# Patient Record
Sex: Female | Born: 1965 | Race: Black or African American | Hispanic: No | Marital: Single | Smoking: Never smoker
Health system: Southern US, Community
[De-identification: ages and names within clinical notes are randomized; demographics above are authoritative.]

## PROBLEM LIST (undated history)

## (undated) DIAGNOSIS — D500 Iron deficiency anemia secondary to blood loss (chronic): Secondary | ICD-10-CM

## (undated) DIAGNOSIS — IMO0002 Reserved for concepts with insufficient information to code with codable children: Secondary | ICD-10-CM

## (undated) DIAGNOSIS — R7989 Other specified abnormal findings of blood chemistry: Secondary | ICD-10-CM

## (undated) DIAGNOSIS — D75838 Other thrombocytosis: Secondary | ICD-10-CM

## (undated) DIAGNOSIS — N840 Polyp of corpus uteri: Secondary | ICD-10-CM

## (undated) DIAGNOSIS — N920 Excessive and frequent menstruation with regular cycle: Secondary | ICD-10-CM

## (undated) DIAGNOSIS — R9430 Abnormal result of cardiovascular function study, unspecified: Secondary | ICD-10-CM

## (undated) DIAGNOSIS — D259 Leiomyoma of uterus, unspecified: Secondary | ICD-10-CM

## (undated) DIAGNOSIS — Z8619 Personal history of other infectious and parasitic diseases: Secondary | ICD-10-CM

## (undated) DIAGNOSIS — I10 Essential (primary) hypertension: Secondary | ICD-10-CM

## (undated) DIAGNOSIS — H53121 Transient visual loss, right eye: Secondary | ICD-10-CM

## (undated) HISTORY — PX: DILATION AND CURETTAGE OF UTERUS: SHX78

## (undated) HISTORY — DX: Iron deficiency anemia secondary to blood loss (chronic): D50.0

---

## 2000-02-22 ENCOUNTER — Ambulatory Visit (HOSPITAL_COMMUNITY): Admission: AD | Admit: 2000-02-22 | Discharge: 2000-02-22 | Payer: Self-pay | Admitting: Obstetrics

## 2000-02-26 ENCOUNTER — Ambulatory Visit (HOSPITAL_COMMUNITY): Admission: RE | Admit: 2000-02-26 | Discharge: 2000-02-26 | Payer: Self-pay | Admitting: *Deleted

## 2000-10-18 HISTORY — PX: TUBAL LIGATION: SHX77

## 2005-06-01 ENCOUNTER — Emergency Department (HOSPITAL_COMMUNITY): Admission: EM | Admit: 2005-06-01 | Discharge: 2005-06-01 | Payer: Self-pay | Admitting: Emergency Medicine

## 2005-07-21 ENCOUNTER — Ambulatory Visit (HOSPITAL_COMMUNITY): Admission: RE | Admit: 2005-07-21 | Discharge: 2005-07-21 | Payer: Self-pay | Admitting: *Deleted

## 2011-11-08 ENCOUNTER — Encounter (HOSPITAL_COMMUNITY): Payer: Self-pay | Admitting: *Deleted

## 2011-11-08 ENCOUNTER — Emergency Department (HOSPITAL_COMMUNITY)
Admission: EM | Admit: 2011-11-08 | Discharge: 2011-11-08 | Disposition: A | Discharge status: Home / Self Care | Payer: Self-pay | Attending: Emergency Medicine | Admitting: Emergency Medicine

## 2011-11-08 DIAGNOSIS — K029 Dental caries, unspecified: Secondary | ICD-10-CM | POA: Insufficient documentation

## 2011-11-08 DIAGNOSIS — K0889 Other specified disorders of teeth and supporting structures: Secondary | ICD-10-CM

## 2011-11-08 DIAGNOSIS — R220 Localized swelling, mass and lump, head: Secondary | ICD-10-CM | POA: Insufficient documentation

## 2011-11-08 DIAGNOSIS — K089 Disorder of teeth and supporting structures, unspecified: Secondary | ICD-10-CM | POA: Insufficient documentation

## 2011-11-08 DIAGNOSIS — R221 Localized swelling, mass and lump, neck: Secondary | ICD-10-CM | POA: Insufficient documentation

## 2011-11-08 MED ORDER — OXYCODONE-ACETAMINOPHEN 5-325 MG PO TABS
2.0000 | ORAL_TABLET | Freq: Once | ORAL | Status: AC
  Administered 2011-11-08: 2 via ORAL
  Filled 2011-11-08: qty 2

## 2011-11-08 MED ORDER — PENICILLIN V POTASSIUM 500 MG PO TABS: 500.0000 mg | ORAL_TABLET | Freq: Three times a day (TID) | ORAL | Status: AC

## 2011-11-08 MED ORDER — OXYCODONE-ACETAMINOPHEN 5-325 MG PO TABS: 1.0000 | ORAL_TABLET | Freq: Four times a day (QID) | ORAL | Status: AC | PRN

## 2011-11-08 NOTE — ED Provider Notes (Signed)
Medical screening examination/treatment/procedure(s) were performed by non-physician practitioner and as supervising physician I was immediately available for consultation/collaboration.   Jaisean Monteforte A Patsy Zaragoza, MD 11/08/11 0633 

## 2011-11-08 NOTE — ED Notes (Signed)
Pt presents w/ c/o dental pain, radiating to face and ear on L lower jaw.

## 2011-11-08 NOTE — ED Provider Notes (Signed)
History     CSN: [id number]  Arrival date & time 11/08/11  0026   First MD Initiated Contact with Patient 11/08/11 [phone]      Chief Complaint  Patient presents with  . Oral Swelling    (Consider location/radiation/quality/duration/timing/severity/associated sxs/prior treatment) HPI   Patient presents to the emergency department with a dental complaint. Symptoms began a couple days ago. The patient has tried to alleviate pain with tylenol and motrin.  Pain rated at a 10/10, characterized as throbbing in nature and located left lower molar. Patient denies fever, night sweats, chills, difficulty swallowing or opening mouth, SOB, nuchal rigidity or decreased ROM of neck.  Patient does not have a dentist and requests a resource guide at discharge.   History reviewed. No pertinent past medical history.  History reviewed. No pertinent past surgical history.  History reviewed. No pertinent family history.  History  Substance Use Topics  . Smoking status: Never Smoker   . Smokeless tobacco: Not on file  . Alcohol Use: No    OB History    Grav Para Term Preterm Abortions TAB SAB Ect Mult Living                  Review of Systems  All other systems reviewed and are negative.    Allergies  Review of patient's allergies indicates not on file.  Home Medications   Current Outpatient Rx  Name Route Sig Dispense Refill  . NAPROXEN SODIUM 220 MG PO TABS Oral Take 220 mg by mouth as needed.      BP 167/112  Pulse 92  Temp(Src) 99.6 F (37.6 C) (Oral)  Resp 20  SpO2 100%  LMP 10/10/2011  Physical Exam  Constitutional: She appears well-developed and well-nourished. No distress.  HENT:  Head: Normocephalic and atraumatic.  Mouth/Throat: Uvula is midline, oropharynx is clear and moist and mucous membranes are normal. Normal dentition. Dental caries (Pts tooth shows no obvious abscess but moderate to severe tenderness to palpation of marked tooth) present. No uvula swelling.     Eyes: Pupils are equal, round, and reactive to light.  Neck: Trachea normal, normal range of motion and full passive range of motion without pain. Neck supple.  Cardiovascular: Normal rate, regular rhythm, normal heart sounds and normal pulses.   Pulmonary/Chest: Effort normal and breath sounds normal. No respiratory distress. Chest wall is not dull to percussion. She exhibits no tenderness, no crepitus, no edema, no deformity and no retraction.  Abdominal: Normal appearance.  Musculoskeletal: Normal range of motion.  Neurological: She is alert. She has normal strength.  Skin: Skin is warm, dry and intact. She is not diaphoretic.  Psychiatric: She has a normal mood and affect. Her speech is normal. Cognition and memory are normal.    ED Course  Procedures (including critical care time)  Labs Reviewed - No data to display No results found.   1. Pain, dental       MDM  Pt given abx, pain medication and a referral to dentist.        Dorthula Matas, PA 11/08/11 0102

## 2015-05-14 ENCOUNTER — Other Ambulatory Visit (HOSPITAL_COMMUNITY)
Admission: RE | Admit: 2015-05-14 | Discharge: 2015-05-14 | Disposition: A | Discharge status: Home / Self Care | Payer: 59 | Source: Ambulatory Visit | Attending: Family | Admitting: Family

## 2015-05-14 ENCOUNTER — Other Ambulatory Visit: Payer: Self-pay

## 2015-05-14 DIAGNOSIS — Z01419 Encounter for gynecological examination (general) (routine) without abnormal findings: Secondary | ICD-10-CM | POA: Insufficient documentation

## 2015-05-14 DIAGNOSIS — N760 Acute vaginitis: Secondary | ICD-10-CM | POA: Diagnosis present

## 2015-05-14 DIAGNOSIS — Z113 Encounter for screening for infections with a predominantly sexual mode of transmission: Secondary | ICD-10-CM | POA: Insufficient documentation

## 2015-05-15 ENCOUNTER — Other Ambulatory Visit: Payer: Self-pay | Admitting: Family

## 2015-05-15 DIAGNOSIS — Z1231 Encounter for screening mammogram for malignant neoplasm of breast: Secondary | ICD-10-CM

## 2015-05-16 LAB — CYTOLOGY - PAP

## 2015-05-26 ENCOUNTER — Other Ambulatory Visit: Payer: Self-pay | Admitting: Family

## 2015-05-26 ENCOUNTER — Other Ambulatory Visit: Payer: Self-pay

## 2015-05-26 DIAGNOSIS — Z1231 Encounter for screening mammogram for malignant neoplasm of breast: Secondary | ICD-10-CM

## 2015-05-26 DIAGNOSIS — N924 Excessive bleeding in the premenopausal period: Secondary | ICD-10-CM

## 2015-05-27 ENCOUNTER — Other Ambulatory Visit: Payer: Self-pay | Admitting: Hematology and Oncology

## 2015-05-27 DIAGNOSIS — D539 Nutritional anemia, unspecified: Secondary | ICD-10-CM

## 2015-05-28 ENCOUNTER — Ambulatory Visit
Admission: RE | Admit: 2015-05-28 | Discharge: 2015-05-28 | Disposition: A | Discharge status: Home / Self Care | Payer: 59 | Source: Ambulatory Visit | Attending: Family | Admitting: Family

## 2015-05-28 ENCOUNTER — Ambulatory Visit (HOSPITAL_BASED_OUTPATIENT_CLINIC_OR_DEPARTMENT_OTHER): Payer: 59 | Admitting: Hematology and Oncology

## 2015-05-28 ENCOUNTER — Encounter: Payer: Self-pay | Admitting: Hematology and Oncology

## 2015-05-28 ENCOUNTER — Encounter (INDEPENDENT_AMBULATORY_CARE_PROVIDER_SITE_OTHER): Payer: Self-pay

## 2015-05-28 ENCOUNTER — Ambulatory Visit: Payer: 59

## 2015-05-28 ENCOUNTER — Other Ambulatory Visit (HOSPITAL_BASED_OUTPATIENT_CLINIC_OR_DEPARTMENT_OTHER): Payer: 59

## 2015-05-28 ENCOUNTER — Telehealth: Payer: Self-pay | Admitting: Hematology and Oncology

## 2015-05-28 VITALS — BP 156/82 | HR 78 | Temp 98.2°F | Resp 18 | Ht 65.0 in | Wt 179.9 lb

## 2015-05-28 DIAGNOSIS — D500 Iron deficiency anemia secondary to blood loss (chronic): Secondary | ICD-10-CM

## 2015-05-28 DIAGNOSIS — N921 Excessive and frequent menstruation with irregular cycle: Secondary | ICD-10-CM | POA: Insufficient documentation

## 2015-05-28 DIAGNOSIS — N924 Excessive bleeding in the premenopausal period: Secondary | ICD-10-CM

## 2015-05-28 DIAGNOSIS — D539 Nutritional anemia, unspecified: Secondary | ICD-10-CM

## 2015-05-28 HISTORY — DX: Iron deficiency anemia secondary to blood loss (chronic): D50.0

## 2015-05-28 LAB — CBC & DIFF AND RETIC
BASO%: 0.9 % (ref 0.0–2.0)
BASOS ABS: 0.1 10*3/uL (ref 0.0–0.1)
EOS%: 2.6 % (ref 0.0–7.0)
Eosinophils Absolute: 0.2 10*3/uL (ref 0.0–0.5)
HEMATOCRIT: 22.5 % — AB (ref 34.8–46.6)
HEMOGLOBIN: 5.6 g/dL — AB (ref 11.6–15.9)
IMMATURE RETIC FRACT: 33.5 % — AB (ref 1.60–10.00)
LYMPH#: 2.9 10*3/uL (ref 0.9–3.3)
LYMPH%: 33.1 % (ref 14.0–49.7)
MCH: 16.4 pg — AB (ref 25.1–34.0)
MCHC: 24.9 g/dL — AB (ref 31.5–36.0)
MCV: 66 fL — ABNORMAL LOW (ref 79.5–101.0)
MONO#: 0.5 10*3/uL (ref 0.1–0.9)
MONO%: 5.9 % (ref 0.0–14.0)
NEUT#: 5 10*3/uL (ref 1.5–6.5)
NEUT%: 57.5 % (ref 38.4–76.8)
Platelets: 637 10*3/uL — ABNORMAL HIGH (ref 145–400)
RBC: 3.41 10*6/uL — ABNORMAL LOW (ref 3.70–5.45)
RDW: 30.5 % — AB (ref 11.2–14.5)
RETIC %: 5.22 % — AB (ref 0.70–2.10)
RETIC CT ABS: 178 10*3/uL — AB (ref 33.70–90.70)
WBC: 8.7 10*3/uL (ref 3.9–10.3)
nRBC: 0 % (ref 0–0)

## 2015-05-28 LAB — FERRITIN CHCC: Ferritin: 21 ng/ml (ref 9–269)

## 2015-05-28 LAB — MORPHOLOGY: PLT EST: INCREASED

## 2015-05-28 LAB — IRON AND TIBC CHCC
%SAT: 4 % — ABNORMAL LOW (ref 21–57)
Iron: 15 ug/dL — ABNORMAL LOW (ref 41–142)
TIBC: 394 ug/dL (ref 236–444)
UIBC: 379 ug/dL (ref 120–384)

## 2015-05-28 LAB — VITAMIN B12: VITAMIN B 12: 434 pg/mL (ref 211–911)

## 2015-05-28 LAB — HOLD TUBE, BLOOD BANK

## 2015-05-28 NOTE — Telephone Encounter (Signed)
per pof to sch pt appt-gave pt copy of avs °

## 2015-05-28 NOTE — Progress Notes (Signed)
[facility] CONSULT NOTE  Patient Care Team: [name], NP as PCP - General (Nurse Practitioner)  CHIEF COMPLAINTS/PURPOSE OF CONSULTATION:  Severe iron deficiency anemia from menorrhagia  HISTORY OF PRESENTING ILLNESS:  Patricia Perry 49 y.o. female is here because of recent finding of severe iron deficiency anemia. The patient went to see her primary doctor recently for routine physical examination. Blood draw showed Severe iron deficiency anemia and the patient declined blood transfusion. Her hemoglobin that was drawn recently was only 4.1 with significant microcytosis.  She denies recent chest pain on exertion, shortness of breath on minimal exertion, pre-syncopal episodes, or palpitations. She complained of frequent leg cramps at night. She had not noticed any recent bleeding such as epistaxis, hematuria or hematochezia The patient denies regular over the counter NSAID ingestion. She is not  on antiplatelets agents. She never have GI workup. She had no prior history or diagnosis of cancer. Her age appropriate screening programs are up-to-date. She denies any pica and eats a variety of diet. She never donated blood or received blood transfusion The patient was prescribed oral iron supplements and she takes 1 supplement a day for the past few days. She tolerated that well.  MEDICAL HISTORY:  Past Medical History  Diagnosis Date  . Anemia   . Iron deficiency anemia due to chronic blood loss 05/28/2015    SURGICAL HISTORY: History reviewed. No pertinent past surgical history.  SOCIAL HISTORY: Social History   Social History  . Marital Status: Single    Spouse Name: N/A  . Number of Children: N/A  . Years of Education: N/A   Occupational History  . Not on file.   Social History Main Topics  . Smoking status: Never Smoker   . Smokeless tobacco: Never Used  . Alcohol Use: No  . Drug Use: No  . Sexual Activity: Yes    Birth Control/ Protection: None,  Surgical   Other Topics Concern  . Not on file   Social History Narrative    FAMILY HISTORY: History reviewed. No pertinent family history.  ALLERGIES:  has No Known Allergies.  MEDICATIONS:  Current Outpatient Prescriptions  Medication Sig Dispense Refill  . ferrous sulfate 325 (65 FE) MG tablet Take 325 mg by mouth daily with breakfast.    . valACYclovir (VALTREX) 500 MG tablet Take 500 mg by mouth daily.  11   No current facility-administered medications for this visit.    REVIEW OF SYSTEMS:   Constitutional: Denies fevers, chills or abnormal night sweats Eyes: Denies blurriness of vision, double vision or watery eyes Ears, nose, mouth, throat, and face: Denies mucositis or sore throat Respiratory: Denies cough, dyspnea or wheezes Cardiovascular: Denies palpitation, chest discomfort or lower extremity swelling Gastrointestinal:  Denies nausea, heartburn or change in bowel habits Skin: Denies abnormal skin rashes Lymphatics: Denies new lymphadenopathy or easy bruising Neurological:Denies numbness, tingling or new weaknesses Behavioral/Psych: Mood is stable, no new changes  All other systems were reviewed with the patient and are negative.  PHYSICAL EXAMINATION: ECOG PERFORMANCE STATUS: 1 - Symptomatic but completely ambulatory  Filed Vitals:   05/28/15 1142  BP: 156/82  Pulse: 78  Temp: 98.2 F (36.8 C)  Resp: 18   Filed Weights   05/28/15 1142  Weight: 179 lb 14.4 oz (81.602 kg)    GENERAL:alert, no distress and comfortable SKIN: skin color, texture, turgor are normal, no rashes or significant lesions EYES: normal, conjunctiva are pale and non-injected, sclera clear OROPHARYNX:no exudate, no erythema and  lips, buccal mucosa, and tongue normal  NECK: supple, thyroid normal size, non-tender, without nodularity LYMPH:  no palpable lymphadenopathy in the cervical, axillary or inguinal LUNGS: clear to auscultation and percussion with normal breathing  effort HEART: regular rate & rhythm and no murmurs and no lower extremity edema ABDOMEN:abdomen soft, non-tender and normal bowel sounds Musculoskeletal:no cyanosis of digits and no clubbing  PSYCH: alert & oriented x 3 with fluent speech NEURO: no focal motor/sensory deficits  LABORATORY DATA:  I have reviewed the data as listed Recent Results (from the past 2160 hour(s))  Cytology - PAP     Status: None   Collection Time: 05/14/15 12:00 AM  Result Value Ref Range   CYTOLOGY - PAP PAP RESULT   CBC & Diff and Retic     Status: Abnormal   Collection Time: 05/28/15 11:14 AM  Result Value Ref Range   WBC 8.7 3.9 - 10.3 10e3/uL   NEUT# 5.0 1.5 - 6.5 10e3/uL   HGB 5.6 (LL) 11.6 - 15.9 g/dL   HCT 22.5 (L) 34.8 - 46.6 %   Platelets 637 (H) 145 - 400 10e3/uL   MCV 66.0 (L) 79.5 - 101.0 fL   MCH 16.4 (L) 25.1 - 34.0 pg   MCHC 24.9 (L) 31.5 - 36.0 g/dL   RBC 3.41 (L) 3.70 - 5.45 10e6/uL   RDW 30.5 (H) 11.2 - 14.5 %   lymph# 2.9 0.9 - 3.3 10e3/uL   MONO# 0.5 0.1 - 0.9 10e3/uL   Eosinophils Absolute 0.2 0.0 - 0.5 10e3/uL   Basophils Absolute 0.1 0.0 - 0.1 10e3/uL   NEUT% 57.5 38.4 - 76.8 %   LYMPH% 33.1 14.0 - 49.7 %   MONO% 5.9 0.0 - 14.0 %   EOS% 2.6 0.0 - 7.0 %   BASO% 0.9 0.0 - 2.0 %   nRBC 0 0 - 0 %   Retic % 5.22 (H) 0.70 - 2.10 %   Retic Ct Abs 178.00 (H) 33.70 - 90.70 10e3/uL   Immature Retic Fract 33.50 (H) 1.60 - 10.00 %  Ferritin     Status: None   Collection Time: 05/28/15 11:14 AM  Result Value Ref Range   Ferritin 21 9 - 269 ng/ml  Morphology     Status: None   Collection Time: 05/28/15 11:14 AM  Result Value Ref Range   Polychromasia Moderate Slight   Tear Drop Cells Few Negative   Ovalocytes Moderate Negative   Shistocytes Few Negative   Target Cells Occ Negative   White Cell Comments C/W auto diff    PLT EST Increased Adequate   Platelet Morphology Occ Large Platelets Within Normal Limits  Iron and TIBC     Status: Abnormal   Collection Time: 05/28/15  11:14 AM  Result Value Ref Range   Iron 15 (L) 41 - 142 ug/dL   TIBC 394 236 - 444 ug/dL   UIBC 379 120 - 384 ug/dL   %SAT 4 (L) 21 - 57 %  Hold Tube, Blood Bank     Status: None   Collection Time: 05/28/15 11:14 AM  Result Value Ref Range   Hold Tube, Blood Bank Blood Bank Order Cancelled     RADIOGRAPHIC STUDIES: I have personally reviewed the radiological images as listed and agreed with the findings in the report. US Transvaginal Non-ob  05/28/2015   CLINICAL DATA:  Menorrhagia, patient is premenopausal and is on tamoxifen  EXAM: TRANSABDOMINAL AND TRANSVAGINAL ULTRASOUND OF PELVIS  TECHNIQUE: Both transabdominal and transvaginal ultrasound  examinations of the pelvis were performed. Transabdominal technique was performed for global imaging of the pelvis including uterus, ovaries, adnexal regions, and pelvic cul-de-sac. It was necessary to proceed with endovaginal exam following the transabdominal exam to visualize the endometrium and adnexal structures.  COMPARISON:  None in PACs  FINDINGS: Uterus  Measurements: 9.7 x 4.7 x 5.1 cm. There is an anterior fundal fibroid measuring 2.7 x 2.5 x 2.3 cm.  Endometrium  Thickness: 9.4 mm.  No focal abnormality visualized.  Right ovary  Measurements: 3.2 x 1.9 x 1.8 cm. Normal appearance/no adnexal mass.  Left ovary  Measurements: 3.9 x 2.4 x 1.7 cm. Normal appearance/no adnexal mass.  Other findings  There is no free pelvic fluid.  IMPRESSION: 1. There is a an anterior fundal fibroid measuring 2.7 cm in greatest dimension. The myometrium is otherwise normal. The endometrial stripe is not abnormally thickened. If bleeding remains unresponsive to hormonal or medical therapy, sonohysterogram should be considered for focal lesion work-up. (Ref: Radiological Reasoning: Algorithmic Workup of Abnormal Vaginal Bleeding with Endovaginal Sonography and Sonohysterography. AJR 2008; 676:P95-09) 2. The ovaries and adnexal regions are unremarkable. There is no free  pelvic fluid.   Electronically Signed   By: David  Martinique M.D.   On: 05/28/2015 15:02   US Pelvis Complete  05/28/2015   CLINICAL DATA:  Menorrhagia, patient is premenopausal and is on tamoxifen  EXAM: TRANSABDOMINAL AND TRANSVAGINAL ULTRASOUND OF PELVIS  TECHNIQUE: Both transabdominal and transvaginal ultrasound examinations of the pelvis were performed. Transabdominal technique was performed for global imaging of the pelvis including uterus, ovaries, adnexal regions, and pelvic cul-de-sac. It was necessary to proceed with endovaginal exam following the transabdominal exam to visualize the endometrium and adnexal structures.  COMPARISON:  None in PACs  FINDINGS: Uterus  Measurements: 9.7 x 4.7 x 5.1 cm. There is an anterior fundal fibroid measuring 2.7 x 2.5 x 2.3 cm.  Endometrium  Thickness: 9.4 mm.  No focal abnormality visualized.  Right ovary  Measurements: 3.2 x 1.9 x 1.8 cm. Normal appearance/no adnexal mass.  Left ovary  Measurements: 3.9 x 2.4 x 1.7 cm. Normal appearance/no adnexal mass.  Other findings  There is no free pelvic fluid.  IMPRESSION: 1. There is a an anterior fundal fibroid measuring 2.7 cm in greatest dimension. The myometrium is otherwise normal. The endometrial stripe is not abnormally thickened. If bleeding remains unresponsive to hormonal or medical therapy, sonohysterogram should be considered for focal lesion work-up. (Ref: Radiological Reasoning: Algorithmic Workup of Abnormal Vaginal Bleeding with Endovaginal Sonography and Sonohysterography. AJR 2008; 326:Z12-45) 2. The ovaries and adnexal regions are unremarkable. There is no free pelvic fluid.   Electronically Signed   By: David  Martinique M.D.   On: 05/28/2015 15:02    ASSESSMENT & PLAN:  Iron deficiency anemia due to chronic blood loss The patient has severe iron deficiency anemia but appears to have responded to oral iron supplements. She is not symptomatic from leg cramps. I recommend she increase oral iron supplement to  twice a day along with prenatal vitamin daily. If she continues to improve on this treatment, we may not have to give her blood transfusion or IV iron. I plan to see her back next month for further assessment.  Menorrhagia with irregular cycle She has irregular menstruation along with perimenopausal symptoms. Recent Pap smear was negative. I recommend she follows with primary care doctor for medical management of menorrhagia    All questions were answered. The patient knows to call the clinic with  any problems, questions or concerns. I spent 30 minutes counseling the patient face to face. The total time spent in the appointment was 40 minutes and more than 50% was on counseling.     [facility], Skyy Nilan, MD 05/28/2015 3:08 PM

## 2015-05-28 NOTE — Assessment & Plan Note (Signed)
She has irregular menstruation along with perimenopausal symptoms. Recent Pap smear was negative. I recommend she follows with primary care doctor for medical management of menorrhagia

## 2015-05-28 NOTE — Assessment & Plan Note (Signed)
The patient has severe iron deficiency anemia but appears to have responded to oral iron supplements. She is not symptomatic from leg cramps. I recommend she increase oral iron supplement to twice a day along with prenatal vitamin daily. If she continues to improve on this treatment, we may not have to give her blood transfusion or IV iron. I plan to see her back next month for further assessment.

## 2015-06-04 ENCOUNTER — Ambulatory Visit
Admission: RE | Admit: 2015-06-04 | Discharge: 2015-06-04 | Disposition: A | Discharge status: Home / Self Care | Payer: 59 | Source: Ambulatory Visit

## 2015-06-04 ENCOUNTER — Other Ambulatory Visit: Payer: Self-pay | Admitting: Obstetrics and Gynecology

## 2015-06-04 DIAGNOSIS — Z1231 Encounter for screening mammogram for malignant neoplasm of breast: Secondary | ICD-10-CM

## 2015-06-06 ENCOUNTER — Other Ambulatory Visit: Payer: Self-pay | Admitting: Family

## 2015-06-06 DIAGNOSIS — R928 Other abnormal and inconclusive findings on diagnostic imaging of breast: Secondary | ICD-10-CM

## 2015-06-18 ENCOUNTER — Other Ambulatory Visit: Payer: 59

## 2015-06-25 ENCOUNTER — Ambulatory Visit
Admission: RE | Admit: 2015-06-25 | Discharge: 2015-06-25 | Disposition: A | Discharge status: Home / Self Care | Payer: 59 | Source: Ambulatory Visit | Attending: Family | Admitting: Family

## 2015-06-25 DIAGNOSIS — R928 Other abnormal and inconclusive findings on diagnostic imaging of breast: Secondary | ICD-10-CM

## 2015-07-16 ENCOUNTER — Other Ambulatory Visit (HOSPITAL_BASED_OUTPATIENT_CLINIC_OR_DEPARTMENT_OTHER): Payer: 59

## 2015-07-16 ENCOUNTER — Encounter: Payer: Self-pay | Admitting: Hematology and Oncology

## 2015-07-16 ENCOUNTER — Ambulatory Visit (HOSPITAL_BASED_OUTPATIENT_CLINIC_OR_DEPARTMENT_OTHER): Payer: 59 | Admitting: Hematology and Oncology

## 2015-07-16 ENCOUNTER — Telehealth: Payer: Self-pay | Admitting: Hematology and Oncology

## 2015-07-16 VITALS — BP 154/87 | HR 77 | Temp 98.1°F | Resp 18 | Wt 186.9 lb

## 2015-07-16 DIAGNOSIS — N63 Unspecified lump in breast: Secondary | ICD-10-CM

## 2015-07-16 DIAGNOSIS — D500 Iron deficiency anemia secondary to blood loss (chronic): Secondary | ICD-10-CM

## 2015-07-16 DIAGNOSIS — N921 Excessive and frequent menstruation with irregular cycle: Secondary | ICD-10-CM | POA: Diagnosis not present

## 2015-07-16 DIAGNOSIS — N6320 Unspecified lump in the left breast, unspecified quadrant: Secondary | ICD-10-CM | POA: Insufficient documentation

## 2015-07-16 LAB — CBC & DIFF AND RETIC
BASO%: 0.7 % (ref 0.0–2.0)
BASOS ABS: 0.1 10*3/uL (ref 0.0–0.1)
EOS ABS: 0.4 10*3/uL (ref 0.0–0.5)
EOS%: 5.7 % (ref 0.0–7.0)
HCT: 29.1 % — ABNORMAL LOW (ref 34.8–46.6)
HEMOGLOBIN: 8 g/dL — AB (ref 11.6–15.9)
IMMATURE RETIC FRACT: 32.7 % — AB (ref 1.60–10.00)
LYMPH#: 2 10*3/uL (ref 0.9–3.3)
LYMPH%: 26 % (ref 14.0–49.7)
MCH: 19 pg — ABNORMAL LOW (ref 25.1–34.0)
MCHC: 27.5 g/dL — ABNORMAL LOW (ref 31.5–36.0)
MCV: 69 fL — ABNORMAL LOW (ref 79.5–101.0)
MONO#: 0.5 10*3/uL (ref 0.1–0.9)
MONO%: 6.2 % (ref 0.0–14.0)
NEUT#: 4.6 10*3/uL (ref 1.5–6.5)
NEUT%: 61.4 % (ref 38.4–76.8)
PLATELETS: 393 10*3/uL (ref 145–400)
RBC: 4.22 10*6/uL (ref 3.70–5.45)
RDW: 21.6 % — ABNORMAL HIGH (ref 11.2–14.5)
RETIC CT ABS: 78.07 10*3/uL (ref 33.70–90.70)
Retic %: 1.85 % (ref 0.70–2.10)
WBC: 7.5 10*3/uL (ref 3.9–10.3)

## 2015-07-16 LAB — FERRITIN CHCC: Ferritin: 72 ng/ml (ref 9–269)

## 2015-07-16 NOTE — Assessment & Plan Note (Signed)
The patient has severe iron deficiency anemia but appears to have responded to oral iron supplements. She is not symptomatic from leg cramps. I recommend she continues oral iron supplement to twice a day along with prenatal vitamin daily. I plan to see her back in 6 months for further assessment.

## 2015-07-16 NOTE — Assessment & Plan Note (Signed)
She has irregular menstruation along with perimenopausal symptoms. Recent Pap smear was negative. I recommend she follows with primary care doctor for medical management of menorrhagia 

## 2015-07-16 NOTE — Progress Notes (Signed)
[facility] OFFICE PROGRESS NOTE  [name], NP SUMMARY OF HEMATOLOGIC HISTORY: Patricia Perry 49 y.o. female is here because of recent finding of severe iron deficiency anemia. The patient went to see her primary doctor recently for routine physical examination. Blood draw showed Severe iron deficiency anemia and the patient declined blood transfusion. Her hemoglobin that was drawn recently was only 4.1 with significant microcytosis.  She denies recent chest pain on exertion, shortness of breath on minimal exertion, pre-syncopal episodes, or palpitations. She complained of frequent leg cramps at night. She had not noticed any recent bleeding such as epistaxis, hematuria or hematochezia The patient denies regular over the counter NSAID ingestion. She is not  on antiplatelets agents. She never have GI workup. She had no prior history or diagnosis of cancer. Her age appropriate screening programs are up-to-date. She denies any pica and eats a variety of diet. She never donated blood or received blood transfusion The patient was prescribed oral iron supplements and she takes 1 supplement a day . The patient was seen in August 2016 and was recommended to increase oral iron supplement to twice a day along with vitamin O-70 and folic acid. Further imaging study revealed uterine fibroids. Biopsy was negative for malignancy.  She has screening mammogram and ultrasound of the left breast which revealed abnormal mass, suspicious for fibroadenoma  INTERVAL HISTORY: Patricia Perry 49 y.o. female returns for  Further follow-up. She tolerate oral iron supplement well. Her symptoms of fatigue has improved.  she continues to have irregular periods. She was recently found to have left breast mass mass. There were no evidence of family history.  I have reviewed the past medical history, past surgical history, social history and family history with the patient and they are unchanged from  previous note.  ALLERGIES:  has No Known Allergies.  MEDICATIONS:  Current Outpatient Prescriptions  Medication Sig Dispense Refill  . ferrous sulfate 325 (65 FE) MG tablet Take 325 mg by mouth daily with breakfast.    . valACYclovir (VALTREX) 500 MG tablet Take 500 mg by mouth daily.  11   No current facility-administered medications for this visit.     REVIEW OF SYSTEMS:   Constitutional: Denies fevers, chills or night sweats Eyes: Denies blurriness of vision Ears, nose, mouth, throat, and face: Denies mucositis or sore throat Respiratory: Denies cough, dyspnea or wheezes Cardiovascular: Denies palpitation, chest discomfort or lower extremity swelling Gastrointestinal:  Denies nausea, heartburn or change in bowel habits Skin: Denies abnormal skin rashes Lymphatics: Denies new lymphadenopathy or easy bruising Neurological:Denies numbness, tingling or new weaknesses Behavioral/Psych: Mood is stable, no new changes  All other systems were reviewed with the patient and are negative.  PHYSICAL EXAMINATION: ECOG PERFORMANCE STATUS: 0 - Asymptomatic  Filed Vitals:   07/16/15 1212  BP: 154/87  Pulse: 77  Temp: 98.1 F (36.7 C)  Resp: 18   Filed Weights   07/16/15 1212  Weight: 186 lb 14.4 oz (84.777 kg)    GENERAL:alert, no distress and comfortable SKIN: skin color, texture, turgor are normal, no rashes or significant lesions EYES: normal, Conjunctiva are pink and non-injected, sclera clear Musculoskeletal:no cyanosis of digits and no clubbing  NEURO: alert & oriented x 3 with fluent speech, no focal motor/sensory deficits  LABORATORY DATA:  I have reviewed the data as listed Results for orders placed or performed in visit on 07/16/15 (from the past 48 hour(s))  CBC & Diff and Retic     Status:  Abnormal   Collection Time: 07/16/15 11:54 AM  Result Value Ref Range   WBC 7.5 3.9 - 10.3 10e3/uL   NEUT# 4.6 1.5 - 6.5 10e3/uL   HGB 8.0 (L) 11.6 - 15.9 g/dL   HCT 29.1 (L)  34.8 - 46.6 %   Platelets 393 145 - 400 10e3/uL   MCV 69.0 (L) 79.5 - 101.0 fL   MCH 19.0 (L) 25.1 - 34.0 pg   MCHC 27.5 (L) 31.5 - 36.0 g/dL   RBC 4.22 3.70 - 5.45 10e6/uL   RDW 21.6 (H) 11.2 - 14.5 %   lymph# 2.0 0.9 - 3.3 10e3/uL   MONO# 0.5 0.1 - 0.9 10e3/uL   Eosinophils Absolute 0.4 0.0 - 0.5 10e3/uL   Basophils Absolute 0.1 0.0 - 0.1 10e3/uL   NEUT% 61.4 38.4 - 76.8 %   LYMPH% 26.0 14.0 - 49.7 %   MONO% 6.2 0.0 - 14.0 %   EOS% 5.7 0.0 - 7.0 %   BASO% 0.7 0.0 - 2.0 %   Retic % 1.85 0.70 - 2.10 %   Retic Ct Abs 78.07 33.70 - 90.70 10e3/uL   Immature Retic Fract 32.70 (H) 1.60 - 10.00 %  Ferritin     Status: None   Collection Time: 07/16/15 11:54 AM  Result Value Ref Range   Ferritin 72 9 - 269 ng/ml    Lab Results  Component Value Date   WBC 7.5 07/16/2015   HGB 8.0* 07/16/2015   HCT 29.1* 07/16/2015   MCV 69.0* 07/16/2015   PLT 393 07/16/2015    ASSESSMENT & PLAN:  Iron deficiency anemia due to chronic blood loss The patient has severe iron deficiency anemia but appears to have responded to oral iron supplements. She is not symptomatic from leg cramps. I recommend she continues oral iron supplement to twice a day along with prenatal vitamin daily. I plan to see her back in 6 months for further assessment.    Menorrhagia with irregular cycle She has irregular menstruation along with perimenopausal symptoms. Recent Pap smear was negative. I recommend she follows with primary care doctor for medical management of menorrhagia    Mass of left breast on mammogram  She has breast mass on the imaging study that asymptomatic. She was recommended repeat ultrasound in 6 months and further follow-up.   All questions were answered. The patient knows to call the clinic with any problems, questions or concerns. No barriers to learning was detected.  I spent 15 minutes counseling the patient face to face. The total time spent in the appointment was 20 minutes and more  than 50% was on counseling.     [facility], NI, MD 9/28/20164:07 PM

## 2015-07-16 NOTE — Assessment & Plan Note (Signed)
She has breast mass on the imaging study that asymptomatic. She was recommended repeat ultrasound in 6 months and further follow-up.

## 2015-07-16 NOTE — Telephone Encounter (Signed)
Gave and printed appt sched and avs for pt for March 2017 °

## 2016-01-12 ENCOUNTER — Telehealth: Payer: Self-pay | Admitting: Hematology and Oncology

## 2016-01-12 NOTE — Telephone Encounter (Signed)
per Dr Alvy Bimler to r/s pt appt-cld & spoke to pt and gave pt updated time & date for 3/29 appt

## 2016-01-14 ENCOUNTER — Other Ambulatory Visit (HOSPITAL_BASED_OUTPATIENT_CLINIC_OR_DEPARTMENT_OTHER): Payer: Self-pay

## 2016-01-14 ENCOUNTER — Ambulatory Visit (HOSPITAL_BASED_OUTPATIENT_CLINIC_OR_DEPARTMENT_OTHER): Payer: Self-pay | Admitting: Hematology and Oncology

## 2016-01-14 ENCOUNTER — Other Ambulatory Visit: Payer: 59

## 2016-01-14 ENCOUNTER — Telehealth: Payer: Self-pay | Admitting: Hematology and Oncology

## 2016-01-14 ENCOUNTER — Ambulatory Visit: Payer: 59 | Admitting: Hematology and Oncology

## 2016-01-14 ENCOUNTER — Encounter: Payer: Self-pay | Admitting: Hematology and Oncology

## 2016-01-14 VITALS — BP 134/89 | HR 97 | Temp 98.1°F | Resp 18 | Ht 65.0 in | Wt 211.5 lb

## 2016-01-14 DIAGNOSIS — D500 Iron deficiency anemia secondary to blood loss (chronic): Secondary | ICD-10-CM

## 2016-01-14 DIAGNOSIS — R7989 Other specified abnormal findings of blood chemistry: Secondary | ICD-10-CM

## 2016-01-14 DIAGNOSIS — D473 Essential (hemorrhagic) thrombocythemia: Secondary | ICD-10-CM

## 2016-01-14 DIAGNOSIS — D75838 Other thrombocytosis: Secondary | ICD-10-CM | POA: Insufficient documentation

## 2016-01-14 LAB — IRON AND TIBC: TIBC: 404 ug/dL (ref 236–444)

## 2016-01-14 LAB — CBC & DIFF AND RETIC
BASO%: 1 % (ref 0.0–2.0)
Basophils Absolute: 0.1 10*3/uL (ref 0.0–0.1)
EOS ABS: 0.2 10*3/uL (ref 0.0–0.5)
EOS%: 3.1 % (ref 0.0–7.0)
HCT: 25.2 % — ABNORMAL LOW (ref 34.8–46.6)
HEMOGLOBIN: 6.5 g/dL — AB (ref 11.6–15.9)
IMMATURE RETIC FRACT: 16.7 % — AB (ref 1.60–10.00)
LYMPH#: 2.2 10*3/uL (ref 0.9–3.3)
LYMPH%: 31.9 % (ref 14.0–49.7)
MCH: 15.9 pg — ABNORMAL LOW (ref 25.1–34.0)
MCHC: 25.8 g/dL — ABNORMAL LOW (ref 31.5–36.0)
MCV: 61.8 fL — AB (ref 79.5–101.0)
MONO#: 0.4 10*3/uL (ref 0.1–0.9)
MONO%: 6.3 % (ref 0.0–14.0)
NEUT%: 57.7 % (ref 38.4–76.8)
NEUTROS ABS: 4.1 10*3/uL (ref 1.5–6.5)
Platelets: 415 10*3/uL — ABNORMAL HIGH (ref 145–400)
RBC: 4.08 10*6/uL (ref 3.70–5.45)
RDW: 18.8 % — ABNORMAL HIGH (ref 11.2–14.5)
RETIC CT ABS: 50.18 10*3/uL (ref 33.70–90.70)
Retic %: 1.23 % (ref 0.70–2.10)
WBC: 7 10*3/uL (ref 3.9–10.3)

## 2016-01-14 LAB — FERRITIN

## 2016-01-14 NOTE — Assessment & Plan Note (Signed)
She is noncompliant and has not taken iron supplement lately. I reinforced the importance of compliance. She is not excessively fatigue and decline IV iron or blood transfusion. I recommend she resume iron supplement at least daily, preferably twice a day and I will bring her back in 6 weeks to monitor her CBC.

## 2016-01-14 NOTE — Telephone Encounter (Signed)
Gave and printed appt shced and avs for pt for May °

## 2016-01-14 NOTE — Assessment & Plan Note (Signed)
This is secondary to iron deficiency.Observe only.

## 2016-01-14 NOTE — Progress Notes (Signed)
[name] OFF[facility] PROGRESS NOTE  [name], NP SUMMARY OF HEMATOLOGIC HISTORY:  Patricia Perry is here because of recent finding of severe iron deficiency anemia. The patient went to see her primary doctor recently for routine physical examination. Blood draw showed Severe iron deficiency anemia and the patient declined blood transfusion. Her hemoglobin that was drawn recently was only 4.1 with significant microcytosis.  She denies recent chest pain on exertion, shortness of breath on minimal exertion, pre-syncopal episodes, or palpitations. She complained of frequent leg cramps at night. She had not noticed any recent bleeding such as epistaxis, hematuria or hematochezia The patient denies regular over the counter NSAID ingestion. She is not  on antiplatelets agents. She never have GI workup. She had no prior history or diagnosis of cancer. Her age appropriate screening programs are up-to-date. She denies any pica and eats a variety of diet. She never donated blood or received blood transfusion The patient was prescribed oral iron supplements and she takes 1 supplement a day . The patient was seen in August 2016 and was recommended to increase oral iron supplement to twice a day along with vitamin 0000000 and folic acid. Further imaging study revealed uterine fibroids. Biopsy was negative for malignancy.  She has screening mammogram and ultrasound of the left breast which revealed abnormal mass, suspicious for fibroadenoma  INTERVAL HISTORY: Patricia Perry 50 y.o. female returns for further follow-up. She is noncompliant with taking oral iron supplements and could not remember when she last took iron supplement. She denies excessive fatigue. No chest pain, shortness of breath or palpitation. She continues to have heavy menstruation. The patient denies any recent signs or symptoms of bleeding such as spontaneous epistaxis, hematuria or hematochezia.   I have reviewed the  past medical history, past surgical history, social history and family history with the patient and they are unchanged from previous note.  ALLERGIES:  has No Known Allergies.  MEDICATIONS:  Current Outpatient Prescriptions  Medication Sig Dispense Refill  . ferrous sulfate 325 (65 FE) MG tablet Take 325 mg by mouth daily with breakfast.    . valACYclovir (VALTREX) 500 MG tablet Take 500 mg by mouth daily.  11   No current facility-administered medications for this visit.     REVIEW OF SYSTEMS:   Constitutional: Denies fevers, chills or night sweats Eyes: Denies blurriness of vision Ears, nose, mouth, throat, and face: Denies mucositis or sore throat Respiratory: Denies cough, dyspnea or wheezes Cardiovascular: Denies palpitation, chest discomfort or lower extremity swelling Gastrointestinal:  Denies nausea, heartburn or change in bowel habits Skin: Denies abnormal skin rashes Lymphatics: Denies new lymphadenopathy or easy bruising Neurological:Denies numbness, tingling or new weaknesses Behavioral/Psych: Mood is stable, no new changes  All other systems were reviewed with the patient and are negative.  PHYSICAL EXAMINATION: ECOG PERFORMANCE STATUS: 0 - Asymptomatic  Filed Vitals:   01/14/16 1137  BP: 134/89  Pulse: 97  Temp: 98.1 F (36.7 C)  Resp: 18   Filed Weights   01/14/16 1137  Weight: 211 lb 8 oz (95.936 kg)    GENERAL:alert, no distress and comfortable SKIN: skin color, texture, turgor are normal, no rashes or significant lesions EYES: normal, Conjunctiva are pink and non-injected, sclera clear Musculoskeletal:no cyanosis of digits and no clubbing  NEURO: alert & oriented x 3 with fluent speech, no focal motor/sensory deficits  LABORATORY DATA:  I have reviewed the data as listed Results for orders placed or performed in visit on 01/14/16 (from  the past 48 hour(s))  CBC & Diff and Retic     Status: Abnormal   Collection Time: 01/14/16 11:20 AM  Result  Value Ref Range   WBC 7.0 3.9 - 10.3 10e3/uL   NEUT# 4.1 1.5 - 6.5 10e3/uL   HGB 6.5 (LL) 11.6 - 15.9 g/dL   HCT 25.2 (L) 34.8 - 46.6 %   Platelets 415 (H) 145 - 400 10e3/uL   MCV 61.8 (L) 79.5 - 101.0 fL   MCH 15.9 (L) 25.1 - 34.0 pg   MCHC 25.8 (L) 31.5 - 36.0 g/dL   RBC 4.08 3.70 - 5.45 10e6/uL   RDW 18.8 (H) 11.2 - 14.5 %   lymph# 2.2 0.9 - 3.3 10e3/uL   MONO# 0.4 0.1 - 0.9 10e3/uL   Eosinophils Absolute 0.2 0.0 - 0.5 10e3/uL   Basophils Absolute 0.1 0.0 - 0.1 10e3/uL   NEUT% 57.7 38.4 - 76.8 %   LYMPH% 31.9 14.0 - 49.7 %   MONO% 6.3 0.0 - 14.0 %   EOS% 3.1 0.0 - 7.0 %   BASO% 1.0 0.0 - 2.0 %   Retic % 1.23 0.70 - 2.10 %   Retic Ct Abs 50.18 33.70 - 90.70 10e3/uL   Immature Retic Fract 16.70 (H) 1.60 - 10.00 %    Lab Results  Component Value Date   WBC 7.0 01/14/2016   HGB 6.5* 01/14/2016   HCT 25.2* 01/14/2016   MCV 61.8* 01/14/2016   PLT 415* 01/14/2016   ASSESSMENT & PLAN:  Iron deficiency anemia due to chronic blood loss She is noncompliant and has not taken iron supplement lately. I reinforced the importance of compliance. She is not excessively fatigue and decline IV iron or blood transfusion. I recommend she resume iron supplement at least daily, preferably twice a day and I will bring her back in 6 weeks to monitor her CBC.  Reactive thrombocytosis This is secondary to iron deficiency.Observe only.     All questions were answered. The patient knows to call the clinic with any problems, questions or concerns. No barriers to learning was detected.  I spent 15 minutes counseling the patient face to face. The total time spent in the appointment was 20 minutes and more than 50% was on counseling.     [facility], Nikole Swartzentruber, MD 3/29/201711:44 AM

## 2016-02-25 ENCOUNTER — Telehealth: Payer: Self-pay | Admitting: *Deleted

## 2016-02-25 ENCOUNTER — Other Ambulatory Visit (HOSPITAL_BASED_OUTPATIENT_CLINIC_OR_DEPARTMENT_OTHER): Payer: BLUE CROSS/BLUE SHIELD

## 2016-02-25 DIAGNOSIS — D500 Iron deficiency anemia secondary to blood loss (chronic): Secondary | ICD-10-CM

## 2016-02-25 LAB — CBC & DIFF AND RETIC
BASO%: 0.6 % (ref 0.0–2.0)
Basophils Absolute: 0 10*3/uL (ref 0.0–0.1)
EOS%: 3.4 % (ref 0.0–7.0)
Eosinophils Absolute: 0.2 10*3/uL (ref 0.0–0.5)
HCT: 37.5 % (ref 34.8–46.6)
HEMOGLOBIN: 11.2 g/dL — AB (ref 11.6–15.9)
IMMATURE RETIC FRACT: 4.8 % (ref 1.60–10.00)
LYMPH#: 1.9 10*3/uL (ref 0.9–3.3)
LYMPH%: 26.5 % (ref 14.0–49.7)
MCH: 23.5 pg — ABNORMAL LOW (ref 25.1–34.0)
MCHC: 29.9 g/dL — ABNORMAL LOW (ref 31.5–36.0)
MCV: 78.6 fL — AB (ref 79.5–101.0)
MONO#: 0.3 10*3/uL (ref 0.1–0.9)
MONO%: 3.5 % (ref 0.0–14.0)
NEUT%: 66 % (ref 38.4–76.8)
NEUTROS ABS: 4.7 10*3/uL (ref 1.5–6.5)
Platelets: 328 10*3/uL (ref 145–400)
RBC: 4.77 10*6/uL (ref 3.70–5.45)
RDW: 26.3 % — AB (ref 11.2–14.5)
RETIC CT ABS: 72.5 10*3/uL (ref 33.70–90.70)
Retic %: 1.52 % (ref 0.70–2.10)
WBC: 7.2 10*3/uL (ref 3.9–10.3)

## 2016-02-25 NOTE — Telephone Encounter (Signed)
Informed pt of lab results and Dr. Calton Dach message below.  She verbalized understanding.

## 2016-02-25 NOTE — Telephone Encounter (Signed)
-----   Message from Heath Lark, MD sent at 02/25/2016  1:05 PM EDT ----- Regarding: cbc PLs let her know CBC is excellent. Continue oral iron and follow-up with PCP in 3 months for repeat blood draw. No need to come back ----- Message -----    From: [facility] Interface    Sent: 02/25/2016  12:41 PM      To: Heath Lark, MD

## 2016-07-07 ENCOUNTER — Encounter: Payer: Self-pay | Admitting: Neurology

## 2016-07-07 ENCOUNTER — Ambulatory Visit (INDEPENDENT_AMBULATORY_CARE_PROVIDER_SITE_OTHER): Payer: BLUE CROSS/BLUE SHIELD | Admitting: Neurology

## 2016-07-07 VITALS — BP 156/100 | HR 71 | Ht 66.0 in | Wt 203.0 lb

## 2016-07-07 DIAGNOSIS — R7309 Other abnormal glucose: Secondary | ICD-10-CM | POA: Diagnosis not present

## 2016-07-07 DIAGNOSIS — G453 Amaurosis fugax: Secondary | ICD-10-CM | POA: Diagnosis not present

## 2016-07-07 DIAGNOSIS — H539 Unspecified visual disturbance: Secondary | ICD-10-CM

## 2016-07-07 DIAGNOSIS — G4489 Other headache syndrome: Secondary | ICD-10-CM

## 2016-07-07 DIAGNOSIS — G458 Other transient cerebral ischemic attacks and related syndromes: Secondary | ICD-10-CM

## 2016-07-07 NOTE — Progress Notes (Addendum)
[id number]    Provider:  Dr Jaynee Eagles Referring Provider: [name], NP Primary Care Physician:  [name], NP  CC:  Transient monocular vision loss.   HPI:  Patricia Perry is a 50 y.o. female here as a referral from Dr. Owens Shark for right vision loss possibly TIA vs Stroke. PMHx Hypertriglyceridemia, TIA, iron deficiency anemia. A month ago she was holding her right sheet and she could only see 1/2 the line followed by a headache. No inciting events, previous illnesses or head trauma or any triggering event.  She could not see the right side of the line. She could se the whole line in the left eye but the right eye it was 1/2. It cleared up a few minutes later.1/2 her vision of her right eye was gone. It was completely blank than it just reappeared and she got the most excruciating headache. She took ibuprofen and the headache did not go away. The headache was right sided and pounding and the temples were hurting. No light sensitivity, sound sensitivity or nausea. Headache was excruciating. The headache lasted all day. It has resolved. She has a headache every 2-3 months which are pounding but she takes something and it goes away. But the day of vision loss it was an 8/10. No other focal associated symptoms or neurologic complaints. No nausea or vomiting. The headache resolved on its own nothing helped, if she sneezed or coughed the headache became worse. Patient has had a TIA in the past in 1988 with acute onset arm numbness and weakness workup was negative per patient.  Reviewed notes, labs and imaging from outside physicians, which showed:   LDL 91  Reviewed notes from [place]. Patient presented with a severe right-sided headache that lasted for 2 days in which was preceded by vision loss in the right eye. Patient does not have a history of headaches. Headache was worsened with coughing or sneezing and slightly better with naproxen. Was not associated with head  injury, photophobia, phonophobia, weakness of her extremities. No paresthesias of extremities. Patient apparently has a history of "light stroke" in 1988 on CT scan of brain possibly due to OCPs. Primary care was concerned especially given patient's history of stroke versus TIA in 1988 and she was sent here for urgent evaluation.   Review of Systems: Patient complains of symptoms per HPI as well as the following symptoms: No CP, no SOB. Pertinent negatives per HPI. All others negative.   Social History   Social History  . Marital status: Single    Spouse name: N/A  . Number of children: 3  . Years of education: 12   Occupational History  . [employer]    Social History Main Topics  . Smoking status: Never Smoker  . Smokeless tobacco: Never Used  . Alcohol use No  . Drug use: No  . Sexual activity: Yes    Birth control/ protection: None, Surgical   Other Topics Concern  . Not on file   Social History Narrative   Lives with son   Caffeine use:     Family History  Problem Relation Age of Onset  . Diabetes Mother   . Hypertension Mother   . Migraines Neg Hx   . Stroke Neg Hx     Past Medical History:  Diagnosis Date  . Anemia   . Iron deficiency anemia due to chronic blood loss 05/28/2015  . Stroke (cerebrum) [facility])     Past Surgical History:  Procedure Laterality Date  .  NO PAST SURGERIES      Current Outpatient Prescriptions  Medication Sig Dispense Refill  . amoxicillin (AMOXIL) 500 MG tablet Take 500 mg by mouth every 12 (twelve) hours.  0   No current facility-administered medications for this visit.     Allergies as of 07/07/2016  . (No Known Allergies)    Vitals: BP (!) 156/100 (BP Location: Right Arm, Patient Position: Sitting, Cuff Size: Large)   Pulse 71   Ht 5\' 6"  (1.676 m)   Wt 203 lb (92.1 kg)   BMI 32.77 kg/m  Last Weight:  Wt Readings from Last 1 Encounters:  07/07/16 203 lb (92.1 kg)   Last Height:   Ht Readings from Last 1 Encounters:   07/07/16 5\' 6"  (1.676 m)     Physical exam: Exam: Gen: NAD, conversant, well nourised, obese, well groomed                     CV: RRR, no MRG. No Carotid Bruits. No peripheral edema, warm, nontender Eyes: Conjunctivae clear without exudates or hemorrhage  Neuro: Detailed Neurologic Exam  Speech:    Speech is normal; fluent and spontaneous with normal comprehension.  Cognition:    The patient is oriented to person, place, and time;     recent and remote memory intact;     language fluent;     normal attention, concentration,     fund of knowledge Cranial Nerves:    The pupils are equal, round, and reactive to light. The fundi are normal and spontaneous venous pulsations are present. Visual fields are full to finger confrontation. Extraocular movements are intact. Trigeminal sensation is intact and the muscles of mastication are normal. The face is symmetric. The palate elevates in the midline. Hearing intact. Voice is normal. Shoulder shrug is normal. The tongue has normal motion without fasciculations.   Coordination:    Normal finger to nose and heel to shin. Normal rapid alternating movements.   Gait:    Heel-toe and tandem gait are normal.   Motor Observation:    No asymmetry, no atrophy, and no involuntary movements noted. Tone:    Normal muscle tone.    Posture:    Posture is normal. normal erect    Strength:    Strength is V/V in the upper and lower limbs.      Sensation: intact to LT     Reflex Exam:  DTR's:    Deep tendon reflexes in the upper and lower extremities are normal bilaterally.   Toes:    The toes are downgoing bilaterally.   Clonus:    Clonus is absent.     Assessment/Plan:  50 year old with PMHx TIA  transient monocular vision loss before a right-sided headache. May be migraine aura however need a stroke/TIA workup as well.   LDL is 91. Goal is 70.Marland Kitchen Patient is advised for weight loss and diet. PCP to check cholesterol in 3 months and if  LDL is greater than 70 to start patient on low-dose Lipitor. Discussed with patient, she should make sure she also mentions this at next appointment. We'll check a hemoglobin A1c today. MRI of the brain MRA of blood vessels Carotid dopplers Echocardiogram Patient should be on 81 mg aspirin daily, advised her to start for stroke prevention.  Addendum: Spoke to Anderson Malta brown regarding abnormal echocardiogram she advised cardiology referral which I agree with as well:   Study Conclusions  - Left ventricle: The cavity size was normal. Wall  thickness was increased in a pattern of mild LVH. Systolic function was mildly to moderately reduced. The estimated ejection fraction was in the range of 40% to 45%. Diffuse hypokinesis. There was no evidence of elevated ventricular filling pressure by Doppler parameters. - Mitral valve: There was mild regurgitation. - Left atrium: The atrium was mildly dilated. Volume/bsa, ES (1-plane Simpson&'s, A4C): 35.6 ml/m^2. - Atrial septum: No defect or patent foramen ovale was identified. Echo contrast study showed no right-to-left atrial level shunt, at baseline or with provocation.  Impressions:  - No cardiac source of emboli was indentified.  I had a long d/w patient about her recent possible TIA, risk for recurrent stroke/TIAs, personally independently reviewed imaging studies and stroke evaluation results and answered questions. Start asa 81mg  for secondary stroke prevention and maintain strict control of hypertension with blood pressure goal below 130/90, diabetes with hemoglobin A1c goal below 6.5% and lipids with LDL cholesterol goal below 70 mg/dL. I also advised the patient to eat a healthy diet with plenty of whole grains, cereals, fruits and vegetables, exercise regularly and maintain ideal body weight  CC: [name], NP  Sarina Ill, MD  [facility] [address] [place], [place]  [address]  Phone [phone] Fax [phone]

## 2016-07-07 NOTE — Patient Instructions (Signed)
Remember to drink plenty of fluid, eat healthy meals and do not skip any meals. Try to eat protein with a every meal and eat a healthy snack such as fruit or nuts in between meals. Try to keep a regular sleep-wake schedule and try to exercise daily, particularly in the form of walking, 20-30 minutes a day, if you can.   As far as your medications are concerned, I would like to suggest: Daily baby aspirin for strok eprevention  As far as diagnostic testing: MRI brain, MRA of the blood vessels in the head, Carotid dopplers, Lab and echocardiogram of the heart  I would like to see you back if needed, sooner if we need to. Please call us with any interim questions, concerns, problems, updates or refill requests.   Our phone number is [phone]. We also have an after hours call service for urgent matters and there is a physician on-call for urgent questions. For any emergencies you know to call 911 or go to the nearest emergency room

## 2016-07-08 ENCOUNTER — Encounter: Payer: Self-pay | Admitting: Neurology

## 2016-07-08 ENCOUNTER — Telehealth: Payer: Self-pay | Admitting: Neurology

## 2016-07-08 DIAGNOSIS — H539 Unspecified visual disturbance: Secondary | ICD-10-CM | POA: Insufficient documentation

## 2016-07-08 LAB — HEMOGLOBIN A1C
Est. average glucose Bld gHb Est-mCnc: 97 mg/dL
Hgb A1c MFr Bld: 5 % (ref 4.8–5.6)

## 2016-07-08 NOTE — Telephone Encounter (Signed)
Called Patient and left her a message relaying. Echo and  Doppler will be done at [facility]. Doppler will be done first. Patient is to arrive [facility] admissions at 8:15 am for 8:45 . September 26 th   Echocardiogram September 26 th at 11:00 am .  Call Report will be sent to  Dr. Cathren Laine cell phone number she is aware.

## 2016-07-12 ENCOUNTER — Telehealth: Payer: Self-pay | Admitting: *Deleted

## 2016-07-12 NOTE — Telephone Encounter (Signed)
-----   Message from Antonia B Ahern, MD sent at 07/08/2016  6:07 PM EDT ----- Labs normal thanks 

## 2016-07-12 NOTE — Telephone Encounter (Signed)
Patient is calling stating she cannot keep the appointment for the echo doppler tomorrow 07-13-16 and needs to reschedule. She can come in 2 weeks on a Thursday or Friday. Please call and advise.

## 2016-07-12 NOTE — Telephone Encounter (Signed)
LVM for pt about normal labs per Dr Jaynee Eagles. Gave [facility] phone number if she has further questions/concerns.

## 2016-07-13 ENCOUNTER — Ambulatory Visit (HOSPITAL_COMMUNITY): Payer: BLUE CROSS/BLUE SHIELD

## 2016-07-13 NOTE — Telephone Encounter (Signed)
Called and spoke to patient's insurance company [employer] notes needed to be faxed in for review for echocardiogram. [id number] and ICD 10- G45.3 and G45.8 . Dr. Cathren Laine note was faxed to [phone]. Telephone [phone].  Patient is aware of all details and I will keep her updated with insurance status.

## 2016-07-14 NOTE — Telephone Encounter (Signed)
Dr. Jaynee Eagles insurance company is wanting to no what are trying to rule out with the echocardiogram ? Stoke / TIA?

## 2016-07-14 NOTE — Telephone Encounter (Signed)
In my documentation it says I am ruling out stroke/TIA. They can read my note if they loke thanks

## 2016-07-14 NOTE — Telephone Encounter (Signed)
Noted Notes where faxed yesterday to Insurance. I will ask Angie in Billing for help. For codes. Thanks Hinton Dyer.

## 2016-07-15 NOTE — Telephone Encounter (Signed)
Called Nurse Back at [facility] relayed that Patient had a TIA in 1988. Patient's history of stroke versus TIA in 1988. After I spoke to Nurse x 3 I asked to use code G45.8 and approval went through for echocardiogram.   Echo approved. # [id number] - 07-13-2016- 08/11/2016.   Patient will be rescheduled and made aware.

## 2016-07-29 ENCOUNTER — Telehealth: Payer: Self-pay | Admitting: *Deleted

## 2016-07-29 ENCOUNTER — Other Ambulatory Visit: Payer: Self-pay | Admitting: Neurology

## 2016-07-29 ENCOUNTER — Ambulatory Visit (HOSPITAL_COMMUNITY)
Admission: RE | Admit: 2016-07-29 | Discharge: 2016-07-29 | Disposition: A | Discharge status: Home / Self Care | Payer: BLUE CROSS/BLUE SHIELD | Source: Ambulatory Visit | Attending: Neurology | Admitting: Neurology

## 2016-07-29 ENCOUNTER — Ambulatory Visit (HOSPITAL_BASED_OUTPATIENT_CLINIC_OR_DEPARTMENT_OTHER)
Admission: RE | Admit: 2016-07-29 | Discharge: 2016-07-29 | Disposition: A | Discharge status: Home / Self Care | Payer: BLUE CROSS/BLUE SHIELD | Source: Ambulatory Visit | Attending: Neurology | Admitting: Neurology

## 2016-07-29 DIAGNOSIS — Z8673 Personal history of transient ischemic attack (TIA), and cerebral infarction without residual deficits: Secondary | ICD-10-CM | POA: Diagnosis not present

## 2016-07-29 DIAGNOSIS — G458 Other transient cerebral ischemic attacks and related syndromes: Secondary | ICD-10-CM | POA: Diagnosis not present

## 2016-07-29 DIAGNOSIS — R9430 Abnormal result of cardiovascular function study, unspecified: Secondary | ICD-10-CM

## 2016-07-29 DIAGNOSIS — G453 Amaurosis fugax: Secondary | ICD-10-CM

## 2016-07-29 DIAGNOSIS — I6523 Occlusion and stenosis of bilateral carotid arteries: Secondary | ICD-10-CM | POA: Diagnosis not present

## 2016-07-29 DIAGNOSIS — I340 Nonrheumatic mitral (valve) insufficiency: Secondary | ICD-10-CM | POA: Insufficient documentation

## 2016-07-29 DIAGNOSIS — R931 Abnormal findings on diagnostic imaging of heart and coronary circulation: Secondary | ICD-10-CM

## 2016-07-29 DIAGNOSIS — I5189 Other ill-defined heart diseases: Secondary | ICD-10-CM

## 2016-07-29 HISTORY — PX: TRANSTHORACIC ECHOCARDIOGRAM: SHX275

## 2016-07-29 LAB — ECHOCARDIOGRAM COMPLETE BUBBLE STUDY
EERAT: 6.54
EWDT: 264 ms
FS: 26 % — AB (ref 28–44)
IV/PV OW: 0.8
LA diam index: 1.44 cm/m2
LASIZE: 29 mm
LAVOL: 67.9 mL
LAVOLA4C: 74.9 mL
LAVOLIN: 33.8 mL/m2
LDCA: 3.46 cm2
LEFT ATRIUM END SYS DIAM: 29 mm
LV E/e' medial: 6.54
LV E/e'average: 6.54
LV PW d: 13.2 mm — AB (ref 0.6–1.1)
LV TDI E'LATERAL: 11.5
LV TDI E'MEDIAL: 6.64
LV e' LATERAL: 11.5 cm/s
LVOT diameter: 21 mm
MV Dec: 264
MV pk A vel: 81 m/s
MV pk E vel: 75.2 m/s
MVPG: 2 mmHg

## 2016-07-29 LAB — VAS US CAROTID
LCCAPDIAS: 32 cm/s
LCCAPSYS: 112 cm/s
LEFT ECA DIAS: -25 cm/s
LEFT VERTEBRAL DIAS: 26 cm/s
LICADDIAS: -44 cm/s
LICADSYS: -122 cm/s
LICAPSYS: -98 cm/s
Left CCA dist dias: -27 cm/s
Left CCA dist sys: -96 cm/s
Left ICA prox dias: -41 cm/s
RCCADSYS: -62 cm/s
RIGHT ECA DIAS: -20 cm/s
RIGHT VERTEBRAL DIAS: 21 cm/s
Right CCA prox dias: 23 cm/s
Right CCA prox sys: 87 cm/s

## 2016-07-29 NOTE — Telephone Encounter (Signed)
-----   Message from Melvenia Beam, MD sent at 07/29/2016  4:41 PM EDT ----- Unremarkable thanks

## 2016-07-29 NOTE — Telephone Encounter (Signed)
Called and spoke to pt. Advised Dr Jaynee Eagles is going to place referral to cardiology and should be contacted to schedule appt within 1-2 weeks. If not, advised her to call back to let me know. She verbalized understanding.

## 2016-07-29 NOTE — Telephone Encounter (Signed)
I spoke to patient about the results below and also to her pcp jennifer brown. Will refer to cardiology, please let patient know thanks.  Study Conclusions  - Left ventricle: The cavity size was normal. Wall thickness was increased in a pattern of mild LVH. Systolic function was mildly to moderately reduced. The estimated ejection fraction was in the range of 40% to 45%. Diffuse hypokinesis. There was no evidence of elevated ventricular filling pressure by Doppler parameters. - Mitral valve: There was mild regurgitation. - Left atrium: The atrium was mildly dilated. Volume/bsa, ES (1-plane Simpson&'s, A4C): 35.6 ml/m^2. - Atrial septum: No defect or patent foramen ovale was identified. Echo contrast study showed no right-to-left atrial level shunt, at baseline or with provocation.  Impressions:  - No cardiac source of emboli was indentified.

## 2016-07-29 NOTE — Telephone Encounter (Signed)
LVM for pt to call about results. Gave [facility] phone number.   *Ok to inform pt labs unremarkable per Dr Jaynee Eagles

## 2016-07-29 NOTE — Progress Notes (Signed)
  Echocardiogram 2D Echocardiogram with bubbles has been performed.  Diamond Nickel 07/29/2016, 11:33 AM

## 2016-07-29 NOTE — Progress Notes (Signed)
*  PRELIMINARY RESULTS* Vascular Ultrasound Carotid Duplex has been completed.  Preliminary findings: Bilateral: No significant (1-39%) ICA stenosis. Antegrade vertebral flow.  Elevated velocities noted in bilateral mid ICA without evidence of plaque morphology. Etiology unknown.    Landry Mellow, RDMS, RVT  07/29/2016, 9:42 AM

## 2016-08-09 ENCOUNTER — Telehealth: Payer: Self-pay | Admitting: Neurology

## 2016-08-09 NOTE — Telephone Encounter (Signed)
[facility] wants to know if pt should start plavix , note said to continue the medication but pt has never been on it. Anderson Malta said Dr. Jaynee Eagles can call tomorrow when she returns. [phone]

## 2016-08-10 NOTE — Telephone Encounter (Signed)
Patient should be on aspirin 81mg  not plavix. Thanks, please call

## 2016-08-10 NOTE — Telephone Encounter (Signed)
LVM for [employer]. Relayed per Dr Jaynee Eagles that patient should be on 81mg  aspirin daily, NOT plavix. Asked her to call back to verify they received message. Gave [facility] phone number.

## 2016-08-11 ENCOUNTER — Other Ambulatory Visit: Payer: BLUE CROSS/BLUE SHIELD

## 2016-08-11 NOTE — Telephone Encounter (Signed)
Called/LVM for [employer] B. Assistant again. Relayed again that pt should be taking 81mg  ASA, not plavix. Asked for return call to verify they received message.

## 2016-08-12 NOTE — Telephone Encounter (Signed)
Called and spoke to pt since unable to reach [employer] office. I tried again this am and unable to reach via all prompts on automated message.  I advised patient that per Dr Jaynee Eagles, she should take ASA 81mg  daily po NOT plavix. Pt verbalized understanding.

## 2016-08-18 NOTE — Progress Notes (Signed)
Cardiology Office Note  NEW PATIENT VISIT   Date:  08/19/2016   ID:  Patricia Perry, DOB September 10, 1966, MRN [id number]  PCP:  [name], NP  Cardiologist:  New Dr. Marlou Porch    Chief Complaint  Patient presents with  . New Patient (Initial Visit)  . Chest Pain    no chest pain  . Shortness of Breath    no SOB      History of Present Illness: Patricia Perry is a 50 y.o. female who presents for abnormal echo found on work up for possible CVA/TIA vs. migraine aura, with recent vision loss with Rt. Sided headache.    EF was 40-45%, diffuse hypokinesis.  Mild MR, LA was mildly dilated.  No cardiac source of emboli identified. MRI pending.   Today she denies chest pain or SOB.  Only time she has been SOB is with her anemia. And once on Iron her SOB is controlled.  She does note that when her last child was born 68 years ago she had problems similar to this but does not know who she saw.  But things improved.   She denies HTN but on review of BPs with office visits BP 134/89 to 156/100.  Today 142/96.    She denies any syncope. She does have heavy menses and plan is for uterine ablation once this can be arranged.          Past Medical History:  Diagnosis Date  . Anemia   . Iron deficiency anemia due to chronic blood loss 05/28/2015  . Stroke (cerebrum) [facility])     Past Surgical History:  Procedure Laterality Date  . NO PAST SURGERIES       Current Outpatient Prescriptions  Medication Sig Dispense Refill  . aspirin EC 81 MG tablet Take 1 tablet (81 mg total) by mouth daily. 90 tablet 3  . losartan (COZAAR) 25 MG tablet Take 1 tablet (25 mg total) by mouth daily. 30 tablet 1  . metoprolol succinate (TOPROL XL) 25 MG 24 hr tablet Take 1 tablet (25 mg total) by mouth daily. 30 tablet 1   No current facility-administered medications for this visit.     Allergies:   Review of patient's allergies indicates no known allergies.    Social History:  The patient  reports that she has  never smoked. She has never used smokeless tobacco. She reports that she does not drink alcohol or use drugs.   Family History:  The patient's family history includes Cancer in her father; Diabetes in her mother; Healthy in her sister; Hypertension in her mother.    ROS:  General:no colds or fevers, no weight changes Skin:no rashes or ulcers HEENT:no blurred vision, no congestion CV:see HPI PUL:see HPI GI:no diarrhea constipation or melena, no indigestion GU:no hematuria, no dysuria MS:no joint pain, no claudication Neuro:no syncope, no lightheadedness- recent episode as described.  Endo:no diabetes, no thyroid disease GYN:  Heavy menses currently   Wt Readings from Last 3 Encounters:  08/19/16 199 lb 8 oz (90.5 kg)  07/07/16 203 lb (92.1 kg)  01/14/16 211 lb 8 oz (95.9 kg)     PHYSICAL EXAM: VS:  BP (!) 142/96   Pulse 74   Ht 5\' 6"  (1.676 m)   Wt 199 lb 8 oz (90.5 kg)   LMP 08/16/2016 (Exact Date)   BMI 32.20 kg/m  , BMI Body mass index is 32.2 kg/m. General:Pleasant affect, NAD Skin:Warm and dry, brisk capillary refill HEENT:normocephalic, sclera clear, mucus  membranes moist Neck:supple, no JVD, no bruits  Heart:S1S2 RRR without murmur, gallup, rub or click Lungs:clear without rales, rhonchi, or wheezes [id number], non tender, + BS, do not palpate liver spleen or masses Ext:no lower ext edema, 2+ pedal pulses, 2+ radial pulses Neuro:alert and oriented, MAE, follows commands, + facial symmetry    EKG:  EKG is ordered today. The ekg ordered today demonstrates SR with PACs and poor R wave prgression    Recent Labs: 02/25/2016: HGB 11.2; Platelets 328    Lipid Panel No results found for: CHOL, TRIG, HDL, CHOLHDL, VLDL, LDLCALC, LDLDIRECT     Other studies Reviewed: Additional studies/ records that were reviewed today include: . Carotid Dopplers: - The vertebral arteries appear patent with antegrade flow. - Findings consistent with 1-39 percent stenosis  involving the   right internal carotid artery and the left internal carotid   artery. - Elevated velocities noted in bilateral mid ICA without evidence   of plaque morphology. Etiology unknown.  ECHO: Study Conclusions  - Left ventricle: The cavity size was normal. Wall thickness was   increased in a pattern of mild LVH. Systolic function was mildly   to moderately reduced. The estimated ejection fraction was in the   range of 40% to 45%. Diffuse hypokinesis. There was no evidence   of elevated ventricular filling pressure by Doppler parameters. - Mitral valve: There was mild regurgitation. - Left atrium: The atrium was mildly dilated. Volume/bsa, ES   (1-plane Simpson&'s, A4C): 35.6 ml/m^2. - Atrial septum: No defect or patent foramen ovale was identified.   Echo contrast study showed no right-to-left atrial level shunt,   at baseline or with provocation.  Impressions:  - No cardiac source of emboli was indentified.  ASSESSMENT AND PLAN:  1.  LV dysfunction with LVH and HTN  Discussed with Dr. Marlou Porch and will add BB and ARB will check BMP today.  I will see her back in 2 weeks for follow up.  We discussed elevated BP and effect on her heart, brain and kidneys.  she is not eager to begin meds but does understand.  Once BP controlled we will recheck echo.  Once BP controlled unless MRI of brain reveals stroke then she would be cleared for uterine ablation.  Dr. Marlou Porch has also talked with pt.   2.  HTN added BB and ARB see below.   2. S/P TIA/CVA vs. Migraine.  MRI/MRA brain pending   3. Iron def anemia on Iron.    4.  Menorrhagia with need for uterine ablation    Current medicines are reviewed with the patient today.  The patient Has no concerns regarding medicines.  The following changes have been made:  See above Labs/ tests ordered today include:see above  Disposition:   FU:  see above  Signed, Cecilie Kicks, NP  08/19/2016 9:03 AM    [facility], [place], [place], [place] Phone: [phone]; Fax: [phone]  Personally seen and examined. Agree with above.  50 year old female with hypertension. Ejection fraction 40-45%. 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 We will go ahead and start her on beta blocker, angiotensin receptor blocker combination. She may need a further ischemic evaluation as well if her ejection fraction does not improve. We will reevaluate her ejection fraction in the future. Heart is regular without any significant murmurs, no crackles on lung. Once her blood pressure is improved, I think  that she will be able to proceed with her uterine ablation.  Candee Furbish, MD

## 2016-08-19 ENCOUNTER — Ambulatory Visit (INDEPENDENT_AMBULATORY_CARE_PROVIDER_SITE_OTHER): Payer: BLUE CROSS/BLUE SHIELD | Admitting: Cardiology

## 2016-08-19 ENCOUNTER — Encounter: Payer: Self-pay | Admitting: Cardiology

## 2016-08-19 ENCOUNTER — Encounter (INDEPENDENT_AMBULATORY_CARE_PROVIDER_SITE_OTHER): Payer: Self-pay

## 2016-08-19 VITALS — BP 142/96 | HR 74 | Ht 66.0 in | Wt 199.5 lb

## 2016-08-19 DIAGNOSIS — I519 Heart disease, unspecified: Secondary | ICD-10-CM

## 2016-08-19 DIAGNOSIS — D508 Other iron deficiency anemias: Secondary | ICD-10-CM

## 2016-08-19 DIAGNOSIS — I10 Essential (primary) hypertension: Secondary | ICD-10-CM

## 2016-08-19 DIAGNOSIS — N920 Excessive and frequent menstruation with regular cycle: Secondary | ICD-10-CM | POA: Diagnosis not present

## 2016-08-19 DIAGNOSIS — R931 Abnormal findings on diagnostic imaging of heart and coronary circulation: Secondary | ICD-10-CM | POA: Diagnosis not present

## 2016-08-19 LAB — BASIC METABOLIC PANEL
BUN: 8 mg/dL (ref 7–25)
CALCIUM: 8.8 mg/dL (ref 8.6–10.2)
CO2: 24 mmol/L (ref 20–31)
CREATININE: 0.85 mg/dL (ref 0.50–1.10)
Chloride: 106 mmol/L (ref 98–110)
Glucose, Bld: 95 mg/dL (ref 65–99)
Potassium: 3.7 mmol/L (ref 3.5–5.3)
SODIUM: 140 mmol/L (ref 135–146)

## 2016-08-19 MED ORDER — ASPIRIN EC 81 MG PO TBEC: 81.0000 mg | DELAYED_RELEASE_TABLET | Freq: Every day | ORAL | 3 refills | Status: DC

## 2016-08-19 MED ORDER — METOPROLOL SUCCINATE ER 25 MG PO TB24: 25.0000 mg | ORAL_TABLET | Freq: Every day | ORAL | 1 refills | Status: DC

## 2016-08-19 MED ORDER — LOSARTAN POTASSIUM 25 MG PO TABS: 25.0000 mg | ORAL_TABLET | Freq: Every day | ORAL | 1 refills | Status: DC

## 2016-08-19 NOTE — Patient Instructions (Addendum)
Medication Instructions:  Your physician has recommended you make the following change in your medication:  1.  START Losartan 25 mg taking 1 daily 2.  START Toprol XL 25 mg taking 1 daily   Labwork: TODAY:  BMET  Testing/Procedures: None ordered  Follow-Up: Your physician recommends that you schedule a follow-up appointment in: 2 [place], NP AND 2-3 MONTHS WITH DR. Marlou Porch   Any Other Special Instructions Will Be Listed Below (If Applicable).     If you need a refill on your cardiac medications before your next appointment, please call your pharmacy.

## 2016-08-26 NOTE — Progress Notes (Signed)
Cardiology Office Note   Date:  08/27/2016   ID:  Patricia Perry, DOB 05/06/1966, MRN [id number]  PCP:  [name], NP  Cardiologist:  Dr. Marlou Porch    Chief Complaint  Patient presents with  . Hypertension      History of Present Illness: Patricia Perry is a 50 y.o. female who presents for BP follow up with abnormal Echo and HTN.   Hx of abnormal echo found on work up for possible CVA/TIA vs. migraine aura, with recent vision loss with Rt. Sided headache.    EF was 40-45%, diffuse hypokinesis.  Mild MR, LA was mildly dilated.  No cardiac source of emboli identified. MRI pending.   Today  He BP is still elevated, she has no chest pain and no SOB.    She does have heavy menses and plan is for uterine ablation once this can be arranged.          Past Medical History:  Diagnosis Date  . Anemia   . Iron deficiency anemia due to chronic blood loss 05/28/2015  . Stroke (cerebrum) [facility])     Past Surgical History:  Procedure Laterality Date  . NO PAST SURGERIES       Current Outpatient Prescriptions  Medication Sig Dispense Refill  . aspirin EC 81 MG tablet Take 1 tablet (81 mg total) by mouth daily. 90 tablet 3  . losartan (COZAAR) 25 MG tablet Take 1 tablet (25 mg total) by mouth daily. 30 tablet 1  . metoprolol succinate (TOPROL XL) 25 MG 24 hr tablet Take 1 tablet (25 mg total) by mouth daily. 30 tablet 1   No current facility-administered medications for this visit.     Allergies:   Patient has no known allergies.    Social History:  The patient  reports that she has never smoked. She has never used smokeless tobacco. She reports that she does not drink alcohol or use drugs.   Family History:  The patient's family history includes Cancer in her father; Diabetes in her mother; Healthy in her sister; Hypertension in her mother.    ROS:  General:no colds or fevers, no weight changes Skin:no rashes or ulcers HEENT:no blurred vision, no congestion CV:see  HPI PUL:see HPI GI:no diarrhea constipation or melena, no indigestion GU:no hematuria, no dysuria MS:no joint pain, no claudication Neuro:no syncope, no lightheadedness Endo:no diabetes, no thyroid disease  Wt Readings from Last 3 Encounters:  08/27/16 200 lb 12.8 oz (91.1 kg)  08/19/16 199 lb 8 oz (90.5 kg)  07/07/16 203 lb (92.1 kg)     PHYSICAL EXAM: VS:  BP (!) 150/80   Pulse 66   Ht 5\' 6"  (1.676 m)   Wt 200 lb 12.8 oz (91.1 kg)   LMP 08/16/2016 (Exact Date)   BMI 32.41 kg/m  , BMI Body mass index is 32.41 kg/m. General:Pleasant affect, NAD Skin:Warm and dry, brisk capillary refill HEENT:normocephalic, sclera clear, mucus membranes moist Neck:supple, no JVD, no bruits  Heart:S1S2 RRR without murmur, gallup, rub or click Lungs:clear without rales, rhonchi, or wheezes Neuro:alert and oriented, MAE, follows commands, + facial symmetry    EKG:  EKG is NOT ordered today.   Recent Labs: 02/25/2016: HGB 11.2; Platelets 328 08/19/2016: BUN 8; Creat 0.85; Potassium 3.7; Sodium 140    Lipid Panel No results found for: CHOL, TRIG, HDL, CHOLHDL, VLDL, LDLCALC, LDLDIRECT     Other studies Reviewed: Additional studies/ records that were reviewed today include:  ECHO 07/2016. Study Conclusions  -  Left ventricle: The cavity size was normal. Wall thickness was   increased in a pattern of mild LVH. Systolic function was mildly   to moderately reduced. The estimated ejection fraction was in the   range of 40% to 45%. Diffuse hypokinesis. There was no evidence   of elevated ventricular filling pressure by Doppler parameters. - Mitral valve: There was mild regurgitation. - Left atrium: The atrium was mildly dilated. Volume/bsa, ES   (1-plane Simpson&'s, A4C): 35.6 ml/m^2. - Atrial septum: No defect or patent foramen ovale was identified.   Echo contrast study showed no right-to-left atrial level shunt,   at baseline or with provocation.  Impressions:  - No cardiac  source of emboli was indentified  ASSESSMENT AND PLAN:   1.  LV dysfunction with LVH and HTN  Discussed with Dr. Marlou Porch and BB and ARB added today will increase dose of ARB.  Once BP controlled we will recheck echo prior to next visit with Dr. Marlou Porch.  Once BP controlled unless MRI of brain reveals stroke then she would be cleared for uterine ablation.    2.  HTN added BB and ARB see below.   2. S/P TIA/CVA vs. Migraine.  MRI/MRA brain pending   3. Iron def anemia on Iron.    4.  Menorrhagia with need for uterine ablation  Current medicines are reviewed with the patient today.  The patient Has no concerns regarding medicines.  The following changes have been made:  See above Labs/ tests ordered today include:see above  Disposition:   FU:  see above  Signed, Cecilie Kicks, NP  08/27/2016 9:28 AM    [address], [place], [place], [place] Phone: [phone]; Fax: [phone]

## 2016-08-27 ENCOUNTER — Ambulatory Visit (INDEPENDENT_AMBULATORY_CARE_PROVIDER_SITE_OTHER): Payer: BLUE CROSS/BLUE SHIELD | Admitting: Cardiology

## 2016-08-27 ENCOUNTER — Encounter: Payer: Self-pay | Admitting: Cardiology

## 2016-08-27 VITALS — BP 150/80 | HR 66 | Ht 66.0 in | Wt 200.8 lb

## 2016-08-27 DIAGNOSIS — I429 Cardiomyopathy, unspecified: Secondary | ICD-10-CM

## 2016-08-27 DIAGNOSIS — I10 Essential (primary) hypertension: Secondary | ICD-10-CM | POA: Diagnosis not present

## 2016-08-27 DIAGNOSIS — N920 Excessive and frequent menstruation with regular cycle: Secondary | ICD-10-CM

## 2016-08-27 MED ORDER — LOSARTAN POTASSIUM 50 MG PO TABS: 50.0000 mg | ORAL_TABLET | Freq: Every day | ORAL | 9 refills | Status: DC

## 2016-08-27 NOTE — Patient Instructions (Signed)
Medication Instructions:  Your physician has recommended you make the following change in your medication:  1. Increase Losartan (50 mg ) daily, sent in today to patient's requested pharmacy.  You make take two of your 25 mg tablets till gone.    Labwork: Your physician recommends that you return for lab work bmet on Friday, November 17 between 8-5.   Testing/Procedures: Your physician has requested that you have an echocardiogram. Echocardiography is a painless test that uses sound waves to create images of your heart. It provides your doctor with information about the size and shape of your heart and how well your heart's chambers and valves are working. This procedure takes approximately one hour. There are no restrictions for this procedure.    Follow-Up: Your physician recommends that you keep your  scheduled  follow-up appointment with Dr. Marlou Porch. Your physician recommends that you keep your scheduled follow-up appointment for BP check.     Any Other Special Instructions Will Be Listed Below (If Applicable).  Make sure you are taking your Iron!!   If you need a refill on your cardiac medications before your next appointment, please call your pharmacy.

## 2016-09-03 ENCOUNTER — Ambulatory Visit (INDEPENDENT_AMBULATORY_CARE_PROVIDER_SITE_OTHER): Payer: BLUE CROSS/BLUE SHIELD | Admitting: Pharmacist Clinician (PhC)/ Clinical Pharmacy Specialist

## 2016-09-03 ENCOUNTER — Other Ambulatory Visit: Payer: BLUE CROSS/BLUE SHIELD | Admitting: *Deleted

## 2016-09-03 ENCOUNTER — Encounter: Payer: Self-pay | Admitting: Pharmacist Clinician (PhC)/ Clinical Pharmacy Specialist

## 2016-09-03 DIAGNOSIS — I10 Essential (primary) hypertension: Secondary | ICD-10-CM

## 2016-09-03 DIAGNOSIS — I429 Cardiomyopathy, unspecified: Secondary | ICD-10-CM

## 2016-09-03 LAB — BASIC METABOLIC PANEL
BUN: 10 mg/dL (ref 7–25)
CALCIUM: 8.8 mg/dL (ref 8.6–10.2)
CO2: 26 mmol/L (ref 20–31)
CREATININE: 0.86 mg/dL (ref 0.50–1.10)
Chloride: 107 mmol/L (ref 98–110)
Glucose, Bld: 96 mg/dL (ref 65–99)
Potassium: 3.8 mmol/L (ref 3.5–5.3)
Sodium: 140 mmol/L (ref 135–146)

## 2016-09-03 NOTE — Progress Notes (Signed)
     09/03/2016 Patricia Perry [birth year] [id number]   HPI:  Patricia Perry is a 50 y.o. female patient of Dr Marlou Porch, with a PMH below who presents today for hypertension clinic evaluation.  She was found to have hypertension after work-up for possible CVA/TIA.  An echo last month also showed an EF of 40-45%.  She was started on metoprolol succinate 25 mg and losartan 25 mg, both once daily.  She saw Cecilie Kicks about a week ago and the losartan was increased to 50 mg because of a pressure of 150/90.      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  Prefers cafeteria take out, as she likes broccoli and vegetables, however eat at fast food weekly.  Drinks orange sodas daily  Exercise:  None other than at work, lifts up to 50 pounds while working  Home BP readings:  None - no meter  Intolerances:   None  Wt Readings from Last 3 Encounters:  09/03/16 197 lb 8 oz (89.6 kg)  08/27/16 200 lb 12.8 oz (91.1 kg)  08/19/16 199 lb 8 oz (90.5 kg)   BP Readings from Last 3 Encounters:  09/03/16 138/90  08/27/16 (!) 150/80  08/19/16 (!) 142/96   Pulse Readings from Last 3 Encounters:  08/27/16 66  08/19/16 74  07/07/16 71    Current Outpatient Prescriptions  Medication Sig Dispense Refill  . aspirin EC 81 MG tablet Take 1 tablet (81 mg total) by mouth daily. 90 tablet 3  . losartan (COZAAR) 50 MG tablet Take 1 tablet (50 mg total) by mouth daily. 30 tablet 9  . metoprolol succinate (TOPROL XL) 25 MG 24 hr tablet Take 1 tablet (25 mg total) by mouth daily. 30 tablet 1   No current facility-administered medications for this visit.     No Known Allergies  Past  Medical History:  Diagnosis Date  . Anemia   . Iron deficiency anemia due to chronic blood loss 05/28/2015  . Stroke (cerebrum) ([facility])     Blood pressure 138/90, height 5\' 6"  (1.676 m), weight 197 lb 8 oz (89.6 kg), last menstrual period 08/16/2016.  Essential hypertension Patient with continued hypertension despite increase in losartan.  He has only been on this for one week at this time.  We had a discussion about lifestyle changes, mostly decreasing the soda consumption and making healthier food choices while on the road.  Will have her return in 2 months and if these changes have not helped, would consider increasing losartan to 100 mg   Tommy Medal PharmD CPP [facility]

## 2016-09-03 NOTE — Patient Instructions (Signed)
Return for a a follow up appointment in 2 months  Your blood pressure today is 138/90 (goal is < 130/90)  Check your blood pressure at home occasionally and keep record of the readings.  Take your BP meds as follows:  Losartan 50 mg daily  Metoprolol succinate 25 mg daily  Bring all of your meds, your BP cuff and your record of home blood pressures to your next appointment.  Exercise as you're able, try to walk approximately 30 minutes per day.  Keep salt intake to a minimum, especially watch canned and prepared boxed foods.  Eat more fresh fruits and vegetables and fewer canned items.  Avoid eating in fast food restaurants.    HOW TO TAKE YOUR BLOOD PRESSURE: . Rest 5 minutes before taking your blood pressure. .  Don't smoke or drink caffeinated beverages for at least 30 minutes before. . Take your blood pressure before (not after) you eat. . Sit comfortably with your back supported and both feet on the floor (don't cross your legs). . Elevate your arm to heart level on a table or a desk. . Use the proper sized cuff. It should fit smoothly and snugly around your bare upper arm. There should be enough room to slip a fingertip under the cuff. The bottom edge of the cuff should be 1 inch above the crease of the elbow. . Ideally, take 3 measurements at one sitting and record the average.

## 2016-09-03 NOTE — Assessment & Plan Note (Signed)
Patient with continued hypertension despite increase in losartan.  He has only been on this for one week at this time.  We had a discussion about lifestyle changes, mostly decreasing the soda consumption and making healthier food choices while on the road.  Will have her return in 2 months and if these changes have not helped, would consider increasing losartan to 100 mg

## 2016-09-23 NOTE — H&P (Signed)
NAMESHANTELLA, FEELEY                  ACCOUNT NO.:  [id number]  MEDICAL RECORD NO.:  [id number]  LOCATION:                                 FACILITY:  PHYSICIAN:  Darlyn Chamber, M.D.   DATE OF BIRTH:  [birth year]  DATE OF ADMISSION: DATE OF DISCHARGE:                             HISTORY & PHYSICAL   DATE OF SURGERY:  September 28, 2016.  HISTORY OF PRESENT ILLNESS:  The patient is a 50 year old, gravida 4, para 85 female who presents for hysteroscopy with MyoSure resection of possible intrauterine fibroids and polyps.  The patient was being evaluated for menorrhagia.  Her cycles would last 7 days, 4 days being heavy, changing pads and tampons every hour with clots and worsening cramping.  She has had a previous bilateral tubal ligation.  Previous endometrial sampling in 2007 was normal.  We did a saline infusion ultrasound here in the office.  It did reveal a probable intrauterine fibroid and polyp.  She also had a hydrosalpinx and a simple cyst of left ovary.  She now presents for hysteroscopy with MyoSure resection.  ALLERGIES:  She has no known drug allergies.  MEDICATIONS:  She is on an antihypertensive in the form of metoprolol and lisinopril.  Also takes iron sulfate supplementation.  We also started her on a low-dose birth control pills just to manage her heavy cycles.  Recent hemoglobin was 10.2.  PAST MEDICAL HISTORY:  She has a history of hypertension, a history of abnormal Pap smears.  Otherwise, usual childhood diseases.  No surgical history.  Three vaginal deliveries.  SOCIAL HISTORY:  Reveals no tobacco or alcohol use.  FAMILY HISTORY:  Noncontributory.  REVIEW OF SYSTEMS:  Noncontributory.  PHYSICAL EXAMINATION:  VITAL SIGNS:  The patient is afebrile with stable vital signs. HEENT:  The patient is normocephalic.  Pupils equal, round, react to light and accommodation.  Extraocular movements were intact.  Sclerae and conjunctivae were clear. LUNGS:   Clear. CARDIOVASCULAR:  Regular rate.  No murmurs or gallops. ABDOMEN:  Benign. PELVIC:  Normal external genitalia.  Vaginal mucosa is clear.  Cervix unremarkable.  Uterus upper limits of normal size, irregular.  Adnexa unremarkable.  IMPRESSION: 1. Menorrhagia with associated possible intrauterine fibroids and     endometrial polyps. 2. Anemia. 3. Hypertension.  PLAN:  The patient will undergo hysteroscopy with MyoSure resection of possible intrauterine fibroid and polyp.  The risks of surgery have been discussed including the risk of infection.  Risk of hemorrhage that could require transfusion with the risk of AIDS or hepatitis.  Excessive bleeding could require hysterectomy.  There is a risk of injury to adjacent organs including bowel that could require further exploratory surgery.  Risk of deep venous thrombosis and pulmonary embolus.  The patient expressed understanding of indications and risks.     Darlyn Chamber, M.D.   ______________________________ Darlyn Chamber, M.D.    JSM/MEDQ  D:  09/23/2016  T:  09/23/2016  Job:  [id number]

## 2016-09-23 NOTE — H&P (Signed)
Patient name Patricia Perry, Patricia Perry DICTATION# [id number] CSN# [id number]  Darlyn Chamber, MD 09/23/2016 10:25 AM

## 2016-09-24 ENCOUNTER — Encounter (HOSPITAL_BASED_OUTPATIENT_CLINIC_OR_DEPARTMENT_OTHER): Payer: Self-pay | Admitting: *Deleted

## 2016-09-24 NOTE — Progress Notes (Addendum)
NPO AFTER MN.  ARRIVE AT 0600.  GETTING LAB WORK DONE Monday 09-27-2016 (CBC, BMET, hCG SERUM).  CURRENT EKG IN CHART AND EPIC.    ADDENDUM:   REVIEWED CHART W/ DR GERMEROTH MDA,  CONCERNING PT CARDIOLOGIST NOTE ON 09-06-2016 STATING THAT PENDING MRI RESULTS, PT DOES NOT HAVE HAD TIA/CVA AND BP CONTROLLED SHE WOULD BE CLEARED FOR SURGERY.  CALLED AND SPOKE W/ HEATHER, OR SCHEDULE FOR DR MCCOMB,  STATES SHE WILL CALL PT ABOUT RESCHEDULING UNTIL MRI DONE AND CARDIOLOGIST CLEAR'S HER FOR SURGERY.

## 2016-09-27 ENCOUNTER — Telehealth: Payer: Self-pay | Admitting: Neurology

## 2016-09-27 ENCOUNTER — Encounter (HOSPITAL_BASED_OUTPATIENT_CLINIC_OR_DEPARTMENT_OTHER)
Admission: RE | Admit: 2016-09-27 | Discharge: 2016-09-27 | Disposition: A | Discharge status: Home / Self Care | Payer: BLUE CROSS/BLUE SHIELD | Source: Ambulatory Visit | Attending: Obstetrics and Gynecology | Admitting: Obstetrics and Gynecology

## 2016-09-27 ENCOUNTER — Encounter (HOSPITAL_BASED_OUTPATIENT_CLINIC_OR_DEPARTMENT_OTHER): Payer: Self-pay | Admitting: Anesthesiology

## 2016-09-27 ENCOUNTER — Telehealth: Payer: Self-pay | Admitting: Cardiology

## 2016-09-27 DIAGNOSIS — D649 Anemia, unspecified: Secondary | ICD-10-CM | POA: Insufficient documentation

## 2016-09-27 DIAGNOSIS — I10 Essential (primary) hypertension: Secondary | ICD-10-CM | POA: Diagnosis not present

## 2016-09-27 DIAGNOSIS — N920 Excessive and frequent menstruation with regular cycle: Secondary | ICD-10-CM | POA: Insufficient documentation

## 2016-09-27 DIAGNOSIS — Z01812 Encounter for preprocedural laboratory examination: Secondary | ICD-10-CM | POA: Diagnosis present

## 2016-09-27 LAB — HCG, SERUM, QUALITATIVE: PREG SERUM: NEGATIVE

## 2016-09-27 LAB — BASIC METABOLIC PANEL
ANION GAP: 10 (ref 5–15)
BUN: 6 mg/dL (ref 6–20)
CALCIUM: 8.7 mg/dL — AB (ref 8.9–10.3)
CO2: 23 mmol/L (ref 22–32)
CREATININE: 0.73 mg/dL (ref 0.44–1.00)
Chloride: 106 mmol/L (ref 101–111)
GFR calc Af Amer: >60 mL/min (ref >60)
GLUCOSE: 104 mg/dL — AB (ref 65–99)
Potassium: 3.4 mmol/L — ABNORMAL LOW (ref 3.5–5.1)
Sodium: 139 mmol/L (ref 135–145)

## 2016-09-27 LAB — CBC
HCT: 36.9 % (ref 36.0–46.0)
Hemoglobin: 11 g/dL — ABNORMAL LOW (ref 12.0–15.0)
MCH: 23.7 pg — AB (ref 26.0–34.0)
MCHC: 29.8 g/dL — AB (ref 30.0–36.0)
MCV: 79.5 fL (ref 78.0–100.0)
PLATELETS: 385 10*3/uL (ref 150–400)
RBC: 4.64 MIL/uL (ref 3.87–5.11)
RDW: 15.7 % — AB (ref 11.5–15.5)
WBC: 8 10*3/uL (ref 4.0–10.5)

## 2016-09-27 NOTE — Telephone Encounter (Signed)
Patient is calling to schedule an MRI. °

## 2016-09-27 NOTE — Telephone Encounter (Signed)
Patient was supposed to have an MRI prior to her uterine ablation and D/C w/ hysteroscopy. Dr. Baltazar Apo office had to reschedule the procedure until the MRI is done.  Please call Santiago Glad back regarding a timeline for when this MRI can be ordered.

## 2016-09-27 NOTE — Telephone Encounter (Signed)
Patricia Perry aware [facility] ordered MRI and to our knowledge they would be scheduling the testing.  She states understanding and will call that office.

## 2016-09-28 ENCOUNTER — Ambulatory Visit (HOSPITAL_BASED_OUTPATIENT_CLINIC_OR_DEPARTMENT_OTHER)
Admission: RE | Admit: 2016-09-28 | Payer: BLUE CROSS/BLUE SHIELD | Source: Ambulatory Visit | Admitting: Obstetrics and Gynecology

## 2016-09-28 ENCOUNTER — Telehealth: Payer: Self-pay | Admitting: Neurology

## 2016-09-28 HISTORY — DX: Personal history of other infectious and parasitic diseases: Z86.19

## 2016-09-28 HISTORY — DX: Leiomyoma of uterus, unspecified: D25.9

## 2016-09-28 HISTORY — DX: Polyp of corpus uteri: N84.0

## 2016-09-28 HISTORY — DX: Other thrombocytosis: D75.838

## 2016-09-28 HISTORY — DX: Excessive and frequent menstruation with regular cycle: N92.0

## 2016-09-28 HISTORY — DX: Essential (primary) hypertension: I10

## 2016-09-28 HISTORY — DX: Reserved for concepts with insufficient information to code with codable children: IMO0002

## 2016-09-28 HISTORY — DX: Transient visual loss, right eye: H53.121

## 2016-09-28 HISTORY — DX: Abnormal result of cardiovascular function study, unspecified: R94.30

## 2016-09-28 HISTORY — DX: Other specified abnormal findings of blood chemistry: R79.89

## 2016-09-28 SURGERY — DILATATION & CURETTAGE/HYSTEROSCOPY WITH MYOSURE
Anesthesia: Choice

## 2016-09-28 NOTE — Telephone Encounter (Signed)
This patient had a approved MRI back in October and she was scheduled to have the MRI on 08/11/16 but then canceled it. Her approval went out on 08/24/16. She called yesterday wanting to schedule her MRI now. I called [employer] to try to get it authorized again it did not approve is is requiring a p2p. The phone number is [phone] [employer] and then p2p. The member's ID number is [id number] & DOb is [birth year]. Please do in in 2 business days. Thank you for your help.

## 2016-09-29 ENCOUNTER — Other Ambulatory Visit: Payer: Self-pay | Admitting: Obstetrics and Gynecology

## 2016-09-29 ENCOUNTER — Telehealth: Payer: Self-pay | Admitting: Neurology

## 2016-09-29 DIAGNOSIS — G44219 Episodic tension-type headache, not intractable: Secondary | ICD-10-CM

## 2016-09-29 NOTE — Telephone Encounter (Signed)
Pending peer to peer

## 2016-09-29 NOTE — Telephone Encounter (Signed)
I had to call and withdraw MR Brain wo with [employer] because her doctor at [facility] is ordering a MRI Brain w/wo contrast.. But I did keep the MRA Head wo contrast just incase you still wanted her to still. But she is having a MRI brain w/wo contrast tomorrow through [facility].

## 2016-09-29 NOTE — Telephone Encounter (Signed)
Yes thanks 

## 2016-09-29 NOTE — Telephone Encounter (Signed)
Lindell Spar, Scientist, physiological with [facility] is calling regarding the patient and would like a returned call in the morning after 8am.

## 2016-09-29 NOTE — Telephone Encounter (Signed)
[id number] through jan 10th approved

## 2016-09-30 ENCOUNTER — Other Ambulatory Visit: Payer: BLUE CROSS/BLUE SHIELD

## 2016-09-30 NOTE — Telephone Encounter (Signed)
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

## 2016-09-30 NOTE — Telephone Encounter (Signed)
Patricia Perry called back and I spoke with her and I informed her that I have withdraw the MRI and that I also informed [name] of this as well.Patricia Perry understood and was going to go from there. I told her if she had any other problems to contact me.

## 2016-10-07 ENCOUNTER — Other Ambulatory Visit: Payer: Self-pay | Admitting: Cardiology

## 2016-10-07 NOTE — Telephone Encounter (Signed)
I faxed order to [facility] for [facility] to schedule

## 2016-10-12 ENCOUNTER — Ambulatory Visit
Admission: RE | Admit: 2016-10-12 | Discharge: 2016-10-12 | Disposition: A | Discharge status: Home / Self Care | Payer: BLUE CROSS/BLUE SHIELD | Source: Ambulatory Visit | Attending: Obstetrics and Gynecology | Admitting: Obstetrics and Gynecology

## 2016-10-12 DIAGNOSIS — G44219 Episodic tension-type headache, not intractable: Secondary | ICD-10-CM

## 2016-10-12 MED ORDER — GADOBENATE DIMEGLUMINE 529 MG/ML IV SOLN
18.0000 mL | Freq: Once | INTRAVENOUS | Status: AC | PRN
  Administered 2016-10-12: 18 mL via INTRAVENOUS

## 2016-10-20 ENCOUNTER — Encounter (HOSPITAL_BASED_OUTPATIENT_CLINIC_OR_DEPARTMENT_OTHER): Payer: Self-pay | Admitting: *Deleted

## 2016-10-20 NOTE — Progress Notes (Signed)
NPO AFTER MN.  ARRIVE AT 0700.  CURRENT LAB RESULTS (CBC, BMET, hCG SERUM) AND EKG IN CHART AND EPIC.  PT STATES NO CHANGE IN HEALTH HISTORY SINCE LAST SPOKEN 09-24-2016 AND IS TAKEN IS TAKING HYPERTENSION MEDS AS PRESCRIBED.  ON 10-15-2016 HAD PT CHART REVIEWED BY DR ROSE MDA, PER REQUEST FROM DR [facility].  SEE PREVIOUS PROGRESS NOTE ON 09-24-2016 BY ME.  PER DR GERMEROTH MDA, PT COULD NOT PROCEED PROCEDURE UNTIL MRI BRAIN DONE DUE ?TIA/CVA.  DR [facility] ORDERED AND PT HAD MRI DONE.  PER RESULTS NO EVIDENCE INFARCT.  PER DR ROSE MDA, OK TO PROCEED.  HAD CALLED DR [facility] OFFICE THAT CASE CAN BE RESCHEDULED.

## 2016-10-21 NOTE — H&P (Signed)
Patient name Patricia Perry, [place] DICTATION# [id number] CSN# [id number]  [facility], MD 10/21/2016 11:57 AM

## 2016-10-22 NOTE — H&P (Signed)
NAMEJENNIVER, Patricia Perry                  ACCOUNT NO.:  [id number]  MEDICAL RECORD NO.:  [id number]  LOCATION:                                 FACILITY:  PHYSICIAN:  Darlyn Chamber, M.D.   DATE OF BIRTH:  1966/08/15  DATE OF ADMISSION: DATE OF DISCHARGE:                             HISTORY & PHYSICAL   DATE OF SURGERY:  October 27, 2016, to be done at [facility] on [address].  HISTORY OF PRESENT ILLNESS:  The patient is a 51 year old, gravida 4, para 80 female who presents for hysteroscopy with MyoSure resection of possible intrauterine fibroid and polyps.  The patient has been evaluated for menorrhagia.  Her cycles last 7 days, 4 days being heavy. She will change pads and tampons every hour with clots and worsening cramping.  She has had a previous bilateral tubal ligation.  Previous endometrial sampling in 2007 was normal.  Saline infusion ultrasound done in the office did reveal possible intrauterine fibroid __________ polyps.  She also has hydrosalpinx with a simple cyst of the left ovary. She now presents for hysteroscopy with MyoSure resection.  ALLERGIES:  She has no known drug allergies.  MEDICATIONS:  She is on an antihypertensive in the form of metoprolol and lisinopril.  She also takes iron sulfate supplementations.  Because of bleeding, was started on low-dose birth control pills.  PAST MEDICAL HISTORY:  She has had a history of hypertension.  History of abnormal Pap smears.  Otherwise usual childhood diseases.  PAST SURGICAL HISTORY:  No surgical history.  She has had three vaginal deliveries.  SOCIAL HISTORY:  Reveals no tobacco or alcohol use.  FAMILY HISTORY:  Noncontributory.  REVIEW OF SYSTEMS:  Noncontributory.  PHYSICAL EXAMINATION:  VITAL SIGNS:  The patient is afebrile with stable vital signs.  HEENT:  The patient is normocephalic.  Pupils are equal, round, and reactive to light and accommodation.  Extraocular movements were intact. Sclerae  and conjunctivae clear.  Oropharynx clear.  NECK:  Without thyromegaly.  BREASTS:  Not examined.  LUNGS:  Clear.  CARDIOVASCULAR:  Regular rate.  No murmurs or gallops.  No carotid or abdominal bruits.  ABDOMEN:  Benign.  PELVIC:  Normal external genitalia.  Vaginal mucosa is clear.  Cervix unremarkable.  Uterus normal size, shape, and contour.  Adnexa free of mass or tenderness.  EXTREMITIES:  Trace edema.  NEUROLOGIC:  Grossly within normal limits.  IMPRESSION: 1. Menorrhagia with possible intrauterine fibroid, __________polyp. 2. Anemia. 3. Hypertension.  PLAN:  The patient will undergo hysteroscopy with MyoSure resection of possible intrauterine fibroid __________ polyps.  The risks of surgery have been discussed including the risk of infection.  The risk of hemorrhage that could require transfusion with the risk of AIDS or hepatitis.  Excessive bleeding could require hysterectomy.  There is a risk of injury to adjacent organs including bowel that could require further exploratory surgery.  Risk of deep venous thrombosis and pulmonary embolus.  The patient expressed understanding of this.     Darlyn Chamber, M.D.     JSM/MEDQ  D:  10/21/2016  T:  10/21/2016  Job:  [id number]

## 2016-10-27 ENCOUNTER — Encounter (HOSPITAL_BASED_OUTPATIENT_CLINIC_OR_DEPARTMENT_OTHER)
Admission: RE | Disposition: A | Discharge status: Home / Self Care | Payer: Self-pay | Source: Ambulatory Visit | Attending: Obstetrics and Gynecology

## 2016-10-27 ENCOUNTER — Ambulatory Visit (HOSPITAL_BASED_OUTPATIENT_CLINIC_OR_DEPARTMENT_OTHER): Payer: BLUE CROSS/BLUE SHIELD | Admitting: Anesthesiology

## 2016-10-27 ENCOUNTER — Encounter (HOSPITAL_BASED_OUTPATIENT_CLINIC_OR_DEPARTMENT_OTHER): Payer: Self-pay

## 2016-10-27 ENCOUNTER — Ambulatory Visit (HOSPITAL_BASED_OUTPATIENT_CLINIC_OR_DEPARTMENT_OTHER)
Admission: RE | Admit: 2016-10-27 | Discharge: 2016-10-27 | Disposition: A | Discharge status: Home / Self Care | Payer: BLUE CROSS/BLUE SHIELD | Source: Ambulatory Visit | Attending: Obstetrics and Gynecology | Admitting: Obstetrics and Gynecology

## 2016-10-27 DIAGNOSIS — N7011 Chronic salpingitis: Secondary | ICD-10-CM | POA: Diagnosis not present

## 2016-10-27 DIAGNOSIS — I10 Essential (primary) hypertension: Secondary | ICD-10-CM | POA: Diagnosis not present

## 2016-10-27 DIAGNOSIS — N83202 Unspecified ovarian cyst, left side: Secondary | ICD-10-CM | POA: Insufficient documentation

## 2016-10-27 DIAGNOSIS — D259 Leiomyoma of uterus, unspecified: Secondary | ICD-10-CM

## 2016-10-27 DIAGNOSIS — N920 Excessive and frequent menstruation with regular cycle: Secondary | ICD-10-CM | POA: Diagnosis not present

## 2016-10-27 DIAGNOSIS — D649 Anemia, unspecified: Secondary | ICD-10-CM | POA: Insufficient documentation

## 2016-10-27 DIAGNOSIS — N800 Endometriosis of uterus: Secondary | ICD-10-CM | POA: Insufficient documentation

## 2016-10-27 HISTORY — DX: Excessive and frequent menstruation with regular cycle: N92.0

## 2016-10-27 HISTORY — PX: DILATATION & CURETTAGE/HYSTEROSCOPY WITH MYOSURE: SHX6511

## 2016-10-27 SURGERY — DILATATION & CURETTAGE/HYSTEROSCOPY WITH MYOSURE
Anesthesia: General | Site: Vagina

## 2016-10-27 MED ORDER — LIDOCAINE HCL 1 % IJ SOLN
INTRAMUSCULAR | Status: DC | PRN
  Administered 2016-10-27: 10 mL

## 2016-10-27 MED ORDER — MIDAZOLAM HCL 5 MG/5ML IJ SOLN
INTRAMUSCULAR | Status: DC | PRN
  Administered 2016-10-27: 2 mg via INTRAVENOUS

## 2016-10-27 MED ORDER — MIDAZOLAM HCL 2 MG/2ML IJ SOLN
INTRAMUSCULAR | Status: AC
  Filled 2016-10-27: qty 2

## 2016-10-27 MED ORDER — FENTANYL CITRATE (PF) 100 MCG/2ML IJ SOLN
INTRAMUSCULAR | Status: DC | PRN
  Administered 2016-10-27: 50 ug via INTRAVENOUS

## 2016-10-27 MED ORDER — DEXAMETHASONE SODIUM PHOSPHATE 4 MG/ML IJ SOLN
INTRAMUSCULAR | Status: DC | PRN
  Administered 2016-10-27: 10 mg via INTRAVENOUS

## 2016-10-27 MED ORDER — ONDANSETRON HCL 4 MG/2ML IJ SOLN
INTRAMUSCULAR | Status: AC
  Filled 2016-10-27: qty 2

## 2016-10-27 MED ORDER — CEFAZOLIN SODIUM-DEXTROSE 2-4 GM/100ML-% IV SOLN
2.0000 g | INTRAVENOUS | Status: AC
  Administered 2016-10-27: 2 g via INTRAVENOUS
  Filled 2016-10-27: qty 100

## 2016-10-27 MED ORDER — PROPOFOL 10 MG/ML IV BOLUS
INTRAVENOUS | Status: DC | PRN
  Administered 2016-10-27: 200 mg via INTRAVENOUS

## 2016-10-27 MED ORDER — HYDROMORPHONE HCL 1 MG/ML IJ SOLN
0.2500 mg | INTRAMUSCULAR | Status: DC | PRN
  Filled 2016-10-27: qty 0.5

## 2016-10-27 MED ORDER — CEFAZOLIN SODIUM-DEXTROSE 2-4 GM/100ML-% IV SOLN
INTRAVENOUS | Status: AC
  Filled 2016-10-27: qty 100

## 2016-10-27 MED ORDER — KETOROLAC TROMETHAMINE 30 MG/ML IJ SOLN
INTRAMUSCULAR | Status: AC
  Filled 2016-10-27: qty 1

## 2016-10-27 MED ORDER — PROPOFOL 10 MG/ML IV BOLUS
INTRAVENOUS | Status: AC
  Filled 2016-10-27: qty 40

## 2016-10-27 MED ORDER — ONDANSETRON HCL 4 MG/2ML IJ SOLN
INTRAMUSCULAR | Status: DC | PRN
  Administered 2016-10-27: 4 mg via INTRAVENOUS

## 2016-10-27 MED ORDER — LACTATED RINGERS IV SOLN
INTRAVENOUS | Status: DC
Start: 2016-10-27 — End: 2016-10-27
  Administered 2016-10-27 (×2): via INTRAVENOUS
  Filled 2016-10-27: qty 1000

## 2016-10-27 MED ORDER — LIDOCAINE 2% (20 MG/ML) 5 ML SYRINGE
INTRAMUSCULAR | Status: AC
  Filled 2016-10-27: qty 5

## 2016-10-27 MED ORDER — DEXAMETHASONE SODIUM PHOSPHATE 10 MG/ML IJ SOLN
INTRAMUSCULAR | Status: AC
  Filled 2016-10-27: qty 1

## 2016-10-27 MED ORDER — KETOROLAC TROMETHAMINE 30 MG/ML IJ SOLN
INTRAMUSCULAR | Status: DC | PRN
  Administered 2016-10-27: 30 mg via INTRAVENOUS

## 2016-10-27 MED ORDER — FENTANYL CITRATE (PF) 100 MCG/2ML IJ SOLN
INTRAMUSCULAR | Status: AC
  Filled 2016-10-27: qty 2

## 2016-10-27 MED ORDER — OXYCODONE-ACETAMINOPHEN 7.5-325 MG PO TABS: 1.0000 | ORAL_TABLET | ORAL | 0 refills | Status: DC | PRN

## 2016-10-27 MED ORDER — LIDOCAINE 2% (20 MG/ML) 5 ML SYRINGE
INTRAMUSCULAR | Status: DC | PRN
  Administered 2016-10-27: 100 mg via INTRAVENOUS

## 2016-10-27 SURGICAL SUPPLY — 42 items
BIPOLAR CUTTING LOOP 21FR (ELECTRODE) ×2
CANISTER SUCTION 2500CC (MISCELLANEOUS) ×12 IMPLANT
CATH ROBINSON RED A/P 16FR (CATHETERS) IMPLANT
COVER BACK TABLE 60X90IN (DRAPES) ×3 IMPLANT
DEVICE MYOSURE LITE (MISCELLANEOUS) IMPLANT
DEVICE MYOSURE REACH (MISCELLANEOUS) IMPLANT
DILATOR CANAL MILEX (MISCELLANEOUS) IMPLANT
DRAPE LG THREE QUARTER DISP (DRAPES) ×3 IMPLANT
DRSG TELFA 3X8 NADH (GAUZE/BANDAGES/DRESSINGS) ×3 IMPLANT
ELECT REM PT RETURN 9FT ADLT (ELECTROSURGICAL) ×3
ELECTRODE REM PT RTRN 9FT ADLT (ELECTROSURGICAL) ×1 IMPLANT
FILTER ARTHROSCOPY CONVERTOR (FILTER) ×3 IMPLANT
GLOVE BIO SURGEON STRL SZ 6.5 (GLOVE) ×2 IMPLANT
GLOVE BIO SURGEON STRL SZ7 (GLOVE) ×6 IMPLANT
GLOVE BIO SURGEONS STRL SZ 6.5 (GLOVE) ×1
GLOVE BIOGEL PI IND STRL 6.5 (GLOVE) ×1 IMPLANT
GLOVE BIOGEL PI INDICATOR 6.5 (GLOVE) ×2
GOWN STRL REUS W/ TWL LRG LVL3 (GOWN DISPOSABLE) IMPLANT
GOWN STRL REUS W/TWL LRG LVL3 (GOWN DISPOSABLE) ×3 IMPLANT
GOWN STRL REUS W/TWL XL LVL3 (GOWN DISPOSABLE) ×3 IMPLANT
IV NS IRRIG 3000ML ARTHROMATIC (IV SOLUTION) ×18 IMPLANT
KIT ROOM TURNOVER WOR (KITS) ×3 IMPLANT
LEGGING LITHOTOMY PAIR STRL (DRAPES) ×3 IMPLANT
LOOP CUT BIPOLAR 24F LRG (ELECTROSURGICAL) ×3 IMPLANT
LOOP CUTTING BIPOLAR 21FR (ELECTRODE) ×1 IMPLANT
MANIFOLD NEPTUNE II (INSTRUMENTS) ×3 IMPLANT
MYOSURE XL FIBROID REM (MISCELLANEOUS) ×3
NEEDLE SPNL 18GX3.5 QUINCKE PK (NEEDLE) ×3 IMPLANT
PACK BASIN DAY SURGERY FS (CUSTOM PROCEDURE TRAY) ×3 IMPLANT
PAD OB MATERNITY 4.3X12.25 (PERSONAL CARE ITEMS) ×3 IMPLANT
PAD PREP 24X48 CUFFED NSTRL (MISCELLANEOUS) ×3 IMPLANT
SEAL ROD LENS SCOPE MYOSURE (ABLATOR) ×3 IMPLANT
SYR CONTROL 10ML LL (SYRINGE) ×3 IMPLANT
SYSTEM TISS REMOVAL MYSR XL RM (MISCELLANEOUS) ×1 IMPLANT
TOWEL OR 17X24 6PK STRL BLUE (TOWEL DISPOSABLE) ×6 IMPLANT
TUBE CONNECTING 12'X1/4 (SUCTIONS) ×2
TUBE CONNECTING 12X1/4 (SUCTIONS) ×4 IMPLANT
TUBING AQUILEX INFLOW (TUBING) ×3 IMPLANT
TUBING AQUILEX OUTFLOW (TUBING) ×3 IMPLANT
TUBING SUCTION BULK 100 FT (MISCELLANEOUS) IMPLANT
TUBING TUR DISP (UROLOGICAL SUPPLIES) ×3 IMPLANT
WATER STERILE IRR 500ML POUR (IV SOLUTION) ×3 IMPLANT

## 2016-10-27 NOTE — Discharge Instructions (Signed)

## 2016-10-27 NOTE — Brief Op Note (Signed)
10/27/2016  9:49 AM  PATIENT:  Patricia Perry  51 y.o. female  PRE-OPERATIVE DIAGNOSIS:  fibroid  POST-OPERATIVE DIAGNOSIS:  fibroid  PROCEDURE:  Procedure(s): DILATATION & CURETTAGE/HYSTEROSCOPY WITH MYOSURE  RESECTION OF ENDOMETRIAL FIBROIDS (N/A)  SURGEON:  Surgeon(s) and Role:    * Arvella Nigh, MD - Primary  PHYSICIAN ASSISTANT:   ASSISTANTS: none   ANESTHESIA:   general and paracervical block  EBL:  Total I/O In: 1000 [I.V.:1000] Out: 25 [Blood:25]  BLOOD ADMINISTERED:none  DRAINS: none   LOCAL MEDICATIONS USED:  XYLOCAINE   SPECIMEN:  Source of Specimen:  fibroid  DISPOSITION OF SPECIMEN:  PATHOLOGY  COUNTS:  YES  TOURNIQUET:  * No tourniquets in log *  DICTATION: .Other Dictation: Dictation Number [id number]  PLAN OF CARE: Discharge to home after PACU  PATIENT DISPOSITION:  PACU - hemodynamically stable.   Delay start of Pharmacological VTE agent (>24hrs) due to surgical blood loss or risk of bleeding: not applicable

## 2016-10-27 NOTE — Transfer of Care (Signed)
Immediate Anesthesia Transfer of Care Note  Patient: Patricia Perry  Procedure(s) Performed: Procedure(s): DILATATION & CURETTAGE/HYSTEROSCOPY WITH MYOSURE  RESECTION OF ENDOMETRIAL FIBROIDS (N/A)  Patient Location: PACU  Anesthesia Type:General  Level of Consciousness: sedated and responds to stimulation  Airway & Oxygen Therapy: Patient Spontanous Breathing and Patient connected to nasal cannula oxygen  Post-op Assessment: Report given to RN  Post vital signs: Reviewed and stable  Last Vitals: 138/96, 64, 14.97%, 97.0 Vitals:   10/27/16 0724  BP: (!) 180/110  Pulse: 70  Resp: 18  Temp: 37 C    Last Pain:  Vitals:   10/27/16 0724  TempSrc: Oral      Patients Stated Pain Goal: 10 ([id number])  Complications: No apparent anesthesia complications

## 2016-10-27 NOTE — Progress Notes (Signed)
History and physical exam unchangedPatient ID: Patricia Perry, female   DOB: 10/03/1966, 51 y.o.   MRN: [id number]

## 2016-10-27 NOTE — Anesthesia Preprocedure Evaluation (Addendum)
Anesthesia Evaluation  Patient identified by MRN, date of birth, ID band Patient awake    Reviewed: Allergy & Precautions, NPO status , Patient's Chart, lab work & pertinent test results  Airway Mallampati: II  TM Distance: >3 FB     Dental   Pulmonary neg pulmonary ROS,    breath sounds clear to auscultation       Cardiovascular hypertension,  Rhythm:Regular Rate:Normal     Neuro/Psych    GI/Hepatic negative GI ROS, Neg liver ROS,   Endo/Other  negative endocrine ROS  Renal/GU negative Renal ROS     Musculoskeletal   Abdominal   Peds  Hematology   Anesthesia Other Findings   Reproductive/Obstetrics                             Anesthesia Physical Anesthesia Plan  ASA: III  Anesthesia Plan: General   Post-op Pain Management:    Induction: Intravenous  Airway Management Planned: LMA  Additional Equipment:   Intra-op Plan:   Post-operative Plan: Extubation in OR  Informed Consent: I have reviewed the patients History and Physical, chart, labs and discussed the procedure including the risks, benefits and alternatives for the proposed anesthesia with the patient or authorized representative who has indicated his/her understanding and acceptance.   Dental advisory given  Plan Discussed with: CRNA and Anesthesiologist  Anesthesia Plan Comments:         Anesthesia Quick Evaluation

## 2016-10-27 NOTE — Anesthesia Postprocedure Evaluation (Signed)
Anesthesia Post Note  Patient: Patricia Perry  Procedure(s) Performed: Procedure(s) (LRB): DILATATION & CURETTAGE/HYSTEROSCOPY WITH MYOSURE  RESECTION OF ENDOMETRIAL FIBROIDS (N/A)  Anesthesia Type: General Level of consciousness: awake Pain management: pain level controlled Respiratory status: spontaneous breathing Cardiovascular status: stable Anesthetic complications: no       Last Vitals:  Vitals:   10/27/16 1030 10/27/16 1045  BP: (!) 159/99 (!) 173/105  Pulse: (!) 53 (!) 59  Resp: 13 (!) 0  Temp:      Last Pain:  Vitals:   10/27/16 1030  TempSrc:   PainSc: 0-No pain                 Joclynn Lumb Billinger

## 2016-10-27 NOTE — Anesthesia Procedure Notes (Signed)
Procedure Name: LMA Insertion Date/Time: 10/27/2016 8:35 AM Performed by: Bethena Roys T Pre-anesthesia Checklist: Patient identified, Emergency Drugs available, Suction available and Patient being monitored Patient Re-evaluated:Patient Re-evaluated prior to inductionOxygen Delivery Method: Circle system utilized Preoxygenation: Pre-oxygenation with 100% oxygen Intubation Type: IV induction Ventilation: Mask ventilation without difficulty LMA: LMA inserted LMA Size: 5.0 Number of attempts: 1 Airway Equipment and Method: Bite block Placement Confirmation: positive ETCO2 Tube secured with: Tape Dental Injury: Teeth and Oropharynx as per pre-operative assessment

## 2016-10-28 ENCOUNTER — Ambulatory Visit: Payer: BLUE CROSS/BLUE SHIELD

## 2016-10-28 ENCOUNTER — Encounter (HOSPITAL_BASED_OUTPATIENT_CLINIC_OR_DEPARTMENT_OTHER): Payer: Self-pay | Admitting: Obstetrics and Gynecology

## 2016-10-28 NOTE — Progress Notes (Deleted)
Patient ID: Patricia Perry                 DOB: [birth year]                      MRN: [id number]     HPI: Patricia Perry is a 51 y.o. female patient of Dr. Marlou Porch with PMH below who presents today for hypertension follow up. She was found to have hypertension after work-up for possible CVA/TIA.  An echo in Oct 2017 also showed an EF of 40-45%.  She was started on metoprolol succinate 25 mg and losartan 25 mg, both once daily. Her losartan was then titrated to 50mg  daily. She was seen by Tommy Medal, PharmD about 2 months ago and a healthy diet was discussed.    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  Prefers cafeteria take out, as she likes broccoli and vegetables, however eat at fast food weekly.  Drinks orange sodas daily  Exercise:             None other than at work, lifts up to 50 pounds while working  Home BP readings:             None - no meter  Intolerances:              None  Wt Readings from Last 3 Encounters:  10/27/16 202 lb (91.6 kg)  09/03/16 197 lb 8 oz (89.6 kg)  08/27/16 200 lb 12.8 oz (91.1 kg)   BP Readings from Last 3 Encounters:  10/27/16 (!) 162/91  09/03/16 138/90  08/27/16 (!) 150/80   Pulse Readings from Last 3 Encounters:  10/27/16 62  08/27/16 66  08/19/16 74    Renal function: CrCl cannot be calculated (Patient's most recent lab result is older than the maximum 21 days allowed.).  Past Medical History:  Diagnosis Date  . Ejection fraction < 50%    per echo 07-29-2016  ef 40-45%  . Heavy menstrual bleeding   . History of herpes simplex type 2 infection    per pcp note 2016 tested positive  . Hypertension   .  Iron deficiency anemia due to chronic blood loss dx 05/28/2015  Hg was 5.6/  last Hg 11.2 of 05/ 2017 in epic   severe per dr Alvy Bimler note ([facility])  iron supplement up to twice daily (per note pt not always compliant)  . Menorrhagia   . 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  . Transient visual loss of right eye    per pt this episode happened approx. 08/ 2017 with headachem took otc med. laid down went away;  pt pcp referred her to neurologist (dr Jaynee Eagles) per note possbile tia vs migraine with aura and ordered stroke work-up, abnormal echo referred to cardiologist (dr Marlou Porch) , dx hypertension, ef 40-45%, put on bb and increased arb medication ---  MRI/MRA pending to determine tia/cva or migraine, pt cx'd   .  Uterine fibroid   . Uterine polyp     Current Outpatient Prescriptions on File Prior to Visit  Medication Sig Dispense Refill  . aspirin EC 81 MG tablet Take 1 tablet (81 mg total) by mouth daily. 90 tablet 3  . BIOTIN PO Take 1 capsule by mouth every evening.    . ferrous sulfate 325 (65 FE) MG EC tablet Take 325 mg by mouth every evening.    Marland Kitchen losartan (COZAAR) 50 MG tablet Take 1 tablet (50 mg total) by mouth daily. (Patient taking differently: Take 50 mg by mouth every evening. ) 30 tablet 9  . metoprolol succinate (TOPROL-XL) 25 MG 24 hr tablet TAKE 1 TABLET(25 MG) BY MOUTH DAILY (Patient taking differently: TAKE 1 TABLET(25 MG) BY MOUTH DAILY---  takes in pm) 30 tablet 11  . oxyCODONE-acetaminophen (PERCOCET) 7.5-325 MG tablet Take 1 tablet by mouth every 4 (four) hours as needed for severe pain. 15 tablet 0   No current facility-administered medications on file prior to visit.     No Known Allergies  Last menstrual period 09/26/2016.   Assessment/Plan: Hypertension:    Thank you, [name], [facility]  10/28/2016 8:46 AM

## 2016-10-28 NOTE — Op Note (Signed)
NAMEGUDIYA, VARDEMAN                  ACCOUNT NO.:  [id number]  MEDICAL RECORD NO.:  [id number]  LOCATION:                                 FACILITY:  PHYSICIAN:  Darlyn Chamber, M.D.   DATE OF BIRTH:  06-20-1966  DATE OF PROCEDURE:  10/27/2016 DATE OF DISCHARGE:                              OPERATIVE REPORT   PREOPERATIVE DIAGNOSIS:  Intrauterine fibroid.  POSTOPERATIVE DIAGNOSIS:  Intrauterine fibroid.  OPERATIVE PROCEDURE:  Paracervical block, cervical dilation, hysteroscopic evaluation with MyoSure as well as resectoscope, resection of the intrauterine fibroid.  SURGEON:  Darlyn Chamber, M.D.  ANESTHESIA:  General with paracervical block.  BLOOD LOSS:  About 100 mL.  PACKS AND DRAINS:  None.  INTRAOPERATIVE BLOOD PLACED:  None.  COMPLICATIONS:  None.  TOTAL DEFICIT:  2000 mL.  PROCEDURE IN DETAIL:  The patient was taken to the OR, placed in a supine position.  After a satisfactory level of general anesthesia was obtained, the patient was placed in dorsal lithotomy position using Allen stirrups.  The perineum and vagina were prepped out with Betadine and draped as a sterile field.  Bladder was emptied by catheterization. Speculum was placed in the vaginal vault.  The cervix was grasped with single-tooth tenaculum.  Uterus sounded to approximately 10 cm.  Cervix was dilated to a size 29 Pratt dilator.  Hysteroscope was then introduced.  Intrauterine cavity distended using saline.  She had a very large intrauterine fibroid extending from what looked like the fundus. The rest of the endometrium was unremarkable.  We brought the large MyoSure in place.  We began resecting.  We were not making very good progress with this.  We then went to the urological resectoscope.  With this, we were able to finish the resection of the fibroid completely from the intrauterine cavity.  There were no signs of perforation or active bleeding at this point in time, and revisualization  of endometrial cavity revealed the fibroid had been completely resected at this point in time.  This was all sent to Pathology.  At this point in time, speculum and single-tooth tenaculum were removed.  The patient was taken out of the dorsal lithotomy position.  Once alert and extubated, transferred to the recovery room in good condition.  Sponge, instrument, and needle counts were reported as correct by circulating nurse.     Darlyn Chamber, M.D.     JSM/MEDQ  D:  10/27/2016  T:  10/28/2016  Job:  [id number]

## 2016-11-18 ENCOUNTER — Other Ambulatory Visit (HOSPITAL_COMMUNITY): Payer: BLUE CROSS/BLUE SHIELD

## 2016-11-23 ENCOUNTER — Ambulatory Visit: Payer: BLUE CROSS/BLUE SHIELD | Admitting: Cardiology

## 2016-11-23 NOTE — Progress Notes (Deleted)
Cardiology Office Note   Date:  11/23/2016   ID:  Patricia Perry, DOB [birth year], MRN [id number]  PCP:  [name], NP  Cardiologist:  Dr. Marlou Porch    No chief complaint on file.     History of Present Illness: Patricia Perry is a 51 y.o. female who presents for BP follow up with abnormal Echo and HTN.   Hx of abnormal echo found on work up for possible CVA/TIA vs. migraine aura, with recent vision loss with Rt. Sided headache.    EF was 40-45%, diffuse hypokinesis.  Mild MR, LA was mildly dilated.  No cardiac source of emboli identified. MRI pending.   Today  He BP is still elevated, she has no chest pain and no SOB.    She does have heavy menses and plan is for uterine ablation once this can be arranged.          Past Medical History:  Diagnosis Date  . Ejection fraction < 50%    per echo 07-29-2016  ef 40-45%  . Heavy menstrual bleeding   . History of herpes simplex type 2 infection    per pcp note 2016 tested positive  . Hypertension   . Iron deficiency anemia due to chronic blood loss dx 05/28/2015  Hg was 5.6/  last Hg 11.2 of 05/ 2017 in epic   severe per dr Alvy Bimler note ([facility])  iron supplement up to twice daily (per note pt not always compliant)  . Menorrhagia   . 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  . Transient visual loss of right eye    per pt this episode happened approx. 08/ 2017 with headachem took otc med. laid down went away;  pt pcp referred her to neurologist (dr Jaynee Eagles) per note possbile tia vs migraine with aura and ordered stroke work-up, abnormal echo referred to cardiologist (dr Marlou Porch) , dx hypertension, ef 40-45%, put on bb and increased arb medication ---  MRI/MRA pending to determine tia/cva or migraine, pt cx'd   . Uterine fibroid   . Uterine polyp     Past Surgical History:  Procedure Laterality Date  . DILATATION & CURETTAGE/HYSTEROSCOPY WITH MYOSURE N/A 10/27/2016   Procedure: DILATATION &  CURETTAGE/HYSTEROSCOPY WITH MYOSURE  RESECTION OF ENDOMETRIAL FIBROIDS;  Surgeon: Arvella Nigh, MD;  Location: [facility];  Service: Gynecology;  Laterality: N/A;  . DILATION AND CURETTAGE OF UTERUS  yrs ago  . TRANSTHORACIC ECHOCARDIOGRAM  07/29/2016   mild LVH, ef 40-45% , diffuse hypokinesis/  mild MR/  mild LAE/  contrast study showed no right-to-left atrial level shunt at baseline or with provocation/  trivial PR  . TUBAL LIGATION Bilateral 2002     Current Outpatient Prescriptions  Medication Sig Dispense Refill  . aspirin EC 81 MG tablet Take 1 tablet (81 mg total) by mouth daily. 90 tablet 3  . BIOTIN PO Take 1 capsule by mouth every evening.    . ferrous sulfate 325 (65 FE) MG EC tablet Take 325 mg by mouth every evening.    Marland Kitchen losartan (COZAAR) 50 MG tablet Take 1 tablet (50 mg total) by mouth daily. (Patient taking differently: Take 50 mg by mouth every evening. ) 30 tablet 9  . metoprolol succinate (TOPROL-XL) 25 MG 24 hr tablet TAKE 1 TABLET(25 MG) BY MOUTH DAILY (Patient taking differently: TAKE 1 TABLET(25 MG) BY MOUTH DAILY---  takes in pm) 30 tablet 11  . oxyCODONE-acetaminophen (PERCOCET) 7.5-325 MG tablet Take  1 tablet by mouth every 4 (four) hours as needed for severe pain. 15 tablet 0   No current facility-administered medications for this visit.     Allergies:   Patient has no known allergies.    Social History:  The patient  reports that she has never smoked. She has never used smokeless tobacco. She reports that she does not drink alcohol or use drugs.   Family History:  The patient's family history includes Cancer in her father; Diabetes in her mother; Healthy in her sister; Hypertension in her mother.    ROS:  General:no colds or fevers, no weight changes Skin:no rashes or ulcers HEENT:no blurred vision, no congestion CV:see HPI PUL:see HPI GI:no diarrhea constipation or melena, no indigestion GU:no hematuria, no dysuria MS:no joint pain, no  claudication Neuro:no syncope, no lightheadedness Endo:no diabetes, no thyroid disease  Wt Readings from Last 3 Encounters:  10/27/16 202 lb (91.6 kg)  09/03/16 197 lb 8 oz (89.6 kg)  08/27/16 200 lb 12.8 oz (91.1 kg)     PHYSICAL EXAM: VS:  There were no vitals taken for this visit. , BMI There is no height or weight on file to calculate BMI. General:Pleasant affect, NAD Skin:Warm and dry, brisk capillary refill HEENT:normocephalic, sclera clear, mucus membranes moist Neck:supple, no JVD, no bruits  Heart:S1S2 RRR without murmur, gallup, rub or click Lungs:clear without rales, rhonchi, or wheezes Neuro:alert and oriented, MAE, follows commands, + facial symmetry    EKG:  EKG is NOT ordered today.   Recent Labs: 09/27/2016: BUN 6; Creatinine, Ser 0.73; Hemoglobin 11.0; Platelets 385; Potassium 3.4; Sodium 139    Lipid Panel No results found for: CHOL, TRIG, HDL, CHOLHDL, VLDL, LDLCALC, LDLDIRECT     Other studies Reviewed: Additional studies/ records that were reviewed today include:  ECHO 07/2016. Study Conclusions  - Left ventricle: The cavity size was normal. Wall thickness was   increased in a pattern of mild LVH. Systolic function was mildly   to moderately reduced. The estimated ejection fraction was in the   range of 40% to 45%. Diffuse hypokinesis. There was no evidence   of elevated ventricular filling pressure by Doppler parameters. - Mitral valve: There was mild regurgitation. - Left atrium: The atrium was mildly dilated. Volume/bsa, ES   (1-plane Simpson&'s, A4C): 35.6 ml/m^2. - Atrial septum: No defect or patent foramen ovale was identified.   Echo contrast study showed no right-to-left atrial level shunt,   at baseline or with provocation.  Impressions:  - No cardiac source of emboli was indentified  ASSESSMENT AND PLAN:   1.  LV dysfunction with LVH and HTN  Discussed with Dr. Marlou Porch and BB and ARB added today will increase dose of ARB.   Once BP controlled we will recheck echo prior to next visit with Dr. Marlou Porch.  Once BP controlled unless MRI of brain reveals stroke then she would be cleared for uterine ablation.    2.  HTN added BB and ARB see below.   2. S/P TIA/CVA vs. Migraine.  MRI/MRA brain pending   3. Iron def anemia on Iron.    4.  Menorrhagia with need for uterine ablation  Current medicines are reviewed with the patient today.  The patient Has no concerns regarding medicines.  The following changes have been made:  See above Labs/ tests ordered today include:see above  Disposition:   FU:  see above  Signed, Candee Furbish, MD  11/23/2016 6:56 AM    [facility]  [address], [place], [place], [place] Phone: [phone]; Fax: [phone]

## 2016-11-24 ENCOUNTER — Encounter: Payer: Self-pay | Admitting: Cardiology

## 2017-05-19 ENCOUNTER — Other Ambulatory Visit: Payer: Self-pay | Admitting: Obstetrics and Gynecology

## 2017-05-19 DIAGNOSIS — N6320 Unspecified lump in the left breast, unspecified quadrant: Secondary | ICD-10-CM

## 2017-05-24 ENCOUNTER — Other Ambulatory Visit: Payer: BLUE CROSS/BLUE SHIELD

## 2017-05-26 ENCOUNTER — Ambulatory Visit
Admission: RE | Admit: 2017-05-26 | Discharge: 2017-05-26 | Disposition: A | Discharge status: Home / Self Care | Payer: BLUE CROSS/BLUE SHIELD | Source: Ambulatory Visit | Attending: Obstetrics and Gynecology | Admitting: Obstetrics and Gynecology

## 2017-05-26 DIAGNOSIS — N6320 Unspecified lump in the left breast, unspecified quadrant: Secondary | ICD-10-CM

## 2017-08-11 ENCOUNTER — Other Ambulatory Visit: Payer: Self-pay | Admitting: Cardiology

## 2017-08-11 DIAGNOSIS — I10 Essential (primary) hypertension: Secondary | ICD-10-CM

## 2017-09-14 ENCOUNTER — Other Ambulatory Visit: Payer: Self-pay | Admitting: Cardiology

## 2017-09-14 DIAGNOSIS — I10 Essential (primary) hypertension: Secondary | ICD-10-CM

## 2017-10-15 ENCOUNTER — Other Ambulatory Visit: Payer: Self-pay | Admitting: Cardiology

## 2017-10-15 DIAGNOSIS — I10 Essential (primary) hypertension: Secondary | ICD-10-CM

## 2017-11-29 ENCOUNTER — Other Ambulatory Visit: Payer: Self-pay | Admitting: Cardiology

## 2017-11-29 DIAGNOSIS — I10 Essential (primary) hypertension: Secondary | ICD-10-CM

## 2017-12-31 ENCOUNTER — Other Ambulatory Visit: Payer: Self-pay | Admitting: Cardiology

## 2017-12-31 DIAGNOSIS — I10 Essential (primary) hypertension: Secondary | ICD-10-CM

## 2018-02-02 ENCOUNTER — Other Ambulatory Visit: Payer: Self-pay | Admitting: Cardiology

## 2018-02-03 ENCOUNTER — Encounter: Payer: Self-pay | Admitting: Nurse Practitioner

## 2018-02-08 ENCOUNTER — Ambulatory Visit: Payer: BLUE CROSS/BLUE SHIELD | Admitting: Nurse Practitioner

## 2018-02-15 ENCOUNTER — Encounter: Payer: Self-pay | Admitting: Nurse Practitioner

## 2018-02-15 ENCOUNTER — Ambulatory Visit: Payer: BLUE CROSS/BLUE SHIELD | Admitting: Nurse Practitioner

## 2018-02-15 VITALS — BP 180/100 | HR 74 | Ht 64.0 in | Wt 220.8 lb

## 2018-02-15 DIAGNOSIS — I429 Cardiomyopathy, unspecified: Secondary | ICD-10-CM

## 2018-02-15 DIAGNOSIS — I10 Essential (primary) hypertension: Secondary | ICD-10-CM | POA: Diagnosis not present

## 2018-02-15 DIAGNOSIS — I519 Heart disease, unspecified: Secondary | ICD-10-CM

## 2018-02-15 MED ORDER — METOPROLOL SUCCINATE ER 25 MG PO TB24: 25.0000 mg | ORAL_TABLET | Freq: Every day | ORAL | 3 refills | Status: DC

## 2018-02-15 MED ORDER — LISINOPRIL 20 MG PO TABS: 20.0000 mg | ORAL_TABLET | Freq: Every day | ORAL | 3 refills | Status: DC

## 2018-02-15 NOTE — Progress Notes (Signed)
CARDIOLOGY OFFICE NOTE  Date:  02/15/2018    Patricia Perry Date of Birth: 09-16-1966 Medical Record #[id number]  PCP:  [facility]  Cardiologist:  [facility]    Chief Complaint  Patient presents with  . Hypertension  . Congestive Heart Failure    Follow up visit -seen for Dr. Marlou Porch    History of Present Illness: Patricia Perry is a 52 y.o. female who presents today for a follow up visit. Seen for Dr. Marlou Porch.   She has had a history of abnormal echo and has a history of HTN. She was noted to have had an EF of 40 to 45% - was getting echo during work up for CVA/TIA vs migraine.   She has not been seen since November of 2017 - saw Cecilie Kicks, NP. Her medicines were adjusted for her BP. Plan was to repeat an echo on return.   Comes in today. Here alone. She says she "is happy that I did not come back". She feels fine. No chest pain. Not short of breath. Not dizzy. Not checking her BP. She is pretty adamant that she has had a repeat echo (I cannot see in Epic) and does not really feel like she needs further testing. She does get too much salt - eats out most meals. No swelling. No more headaches. She really has no concerns and only wants to get her medicines refilled. She was unaware of the Losartan recall.   Past Medical History:  Diagnosis Date  . Ejection fraction < 50%    per echo 07-29-2016  ef 40-45%  . Heavy menstrual bleeding   . History of herpes simplex type 2 infection    per pcp note 2016 tested positive  . Hypertension   . Iron deficiency anemia due to chronic blood loss dx 05/28/2015  Hg was 5.6/  last Hg 11.2 of 05/ 2017 in epic   severe per dr Alvy Bimler note ([facility])  iron supplement up to twice daily (per note pt not always compliant)  . Menorrhagia   . 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  . Transient visual loss of right eye    per pt this episode happened approx. 08/ 2017 with headachem took otc med. laid down went  away;  pt pcp referred her to neurologist (dr Jaynee Eagles) per note possbile tia vs migraine with aura and ordered stroke work-up, abnormal echo referred to cardiologist (dr Marlou Porch) , dx hypertension, ef 40-45%, put on bb and increased arb medication ---  MRI/MRA pending to determine tia/cva or migraine, pt cx'd   . Uterine fibroid   . Uterine polyp     Past Surgical History:  Procedure Laterality Date  . DILATATION & CURETTAGE/HYSTEROSCOPY WITH MYOSURE N/A 10/27/2016   Procedure: DILATATION & CURETTAGE/HYSTEROSCOPY WITH MYOSURE  RESECTION OF ENDOMETRIAL FIBROIDS;  Surgeon: Arvella Nigh, MD;  Location: [facility];  Service: Gynecology;  Laterality: N/A;  . DILATION AND CURETTAGE OF UTERUS  yrs ago  . TRANSTHORACIC ECHOCARDIOGRAM  07/29/2016   mild LVH, ef 40-45% , diffuse hypokinesis/  mild MR/  mild LAE/  contrast study showed no right-to-left atrial level shunt at baseline or with provocation/  trivial PR  . TUBAL LIGATION Bilateral 2002     Medications: Current Meds  Medication Sig  . BIOTIN PO Take 1 capsule by mouth every evening.  . metoprolol succinate (TOPROL-XL) 25 MG 24 hr tablet Take 1 tablet (25 mg total)  by mouth daily.  . [DISCONTINUED] aspirin EC 81 MG tablet Take 1 tablet (81 mg total) by mouth daily.  . [DISCONTINUED] ferrous sulfate 325 (65 FE) MG EC tablet Take 325 mg by mouth every evening.  . [DISCONTINUED] losartan (COZAAR) 50 MG tablet TAKE 1 TABLET BY MOUTH ONCE DAILY. PLEASE MAKE OVERDUE APPOINTMENT WITH DOCTOR SKAINS BEFORE ANYMORE REFILLS  . [DISCONTINUED] metoprolol succinate (TOPROL-XL) 25 MG 24 hr tablet Take 1 tablet (25 mg total) by mouth daily. Please keep upcoming appt in May before anymore refills. Final Attempt  . [DISCONTINUED] oxyCODONE-acetaminophen (PERCOCET) 7.5-325 MG tablet Take 1 tablet by mouth every 4 (four) hours as needed for severe pain.     Allergies: No Known Allergies  Social History: The patient  reports that she has never  smoked. She has never used smokeless tobacco. She reports that she does not drink alcohol or use drugs.   Family History: The patient's family history includes Cancer in her father; Diabetes in her mother; Healthy in her sister; Hypertension in her mother.   Review of Systems: Please see the history of present illness.   Otherwise, the review of systems is positive for none.   All other systems are reviewed and negative.   Physical Exam: VS:  BP (!) 180/100 (BP Location: Left Arm, Patient Position: Sitting, Cuff Size: Large)   Pulse 74   Ht 5\' 4"  (1.626 m)   Wt 220 lb 12.8 oz (100.2 kg)   BMI 37.90 kg/m  .  BMI Body mass index is 37.9 kg/m.  Wt Readings from Last 3 Encounters:  02/15/18 220 lb 12.8 oz (100.2 kg)  10/27/16 202 lb (91.6 kg)  09/03/16 197 lb 8 oz (89.6 kg)    General: Very talkative. Obese. Alert and in no acute distress.   HEENT: Normal.  Neck: Supple, no JVD, carotid bruits, or masses noted.  Cardiac: Regular rate and rhythm. No murmurs, rubs, or gallops. No edema.  Respiratory:  Lungs are clear to auscultation bilaterally with normal work of breathing.  GI: Soft and nontender.  MS: No deformity or atrophy. Gait and ROM intact.  Skin: Warm and dry. Color is normal.  Neuro:  Strength and sensation are intact and no gross focal deficits noted.  Psych: Alert, appropriate and with normal affect.   LABORATORY DATA:  EKG:  EKG is ordered today. This shows NSR with poor R wave progression.   Lab Results  Component Value Date   WBC 8.0 09/27/2016   HGB 11.0 (L) 09/27/2016   HCT 36.9 09/27/2016   PLT 385 09/27/2016   GLUCOSE 104 (H) 09/27/2016   NA 139 09/27/2016   K 3.4 (L) 09/27/2016   CL 106 09/27/2016   CREATININE 0.73 09/27/2016   BUN 6 09/27/2016   CO2 23 09/27/2016   HGBA1C 5.0 07/07/2016       BNP (last 3 results) No results for input(s): BNP in the last 8760 hours.  ProBNP (last 3 results) No results for input(s): PROBNP in the last 8760  hours.   Other Studies Reviewed Today:  ECHO 07/2016. Study Conclusions  - Left ventricle: The cavity size was normal. Wall thickness was increased in a pattern of mild LVH. Systolic function was mildly to moderately reduced. The estimated ejection fraction was in the range of 40% to 45%. Diffuse hypokinesis. There was no evidence of elevated ventricular filling pressure by Doppler parameters. - Mitral valve: There was mild regurgitation. - Left atrium: The atrium was mildly dilated. Volume/bsa, ES (1-plane  Simpson&'s, A4C): 35.6 ml/m^2. - Atrial septum: No defect or patent foramen ovale was identified. Echo contrast study showed no right-to-left atrial level shunt, at baseline or with provocation.  Impressions:  - No cardiac source of emboli was indentified  ASSESSMENT AND PLAN:  1. History of LV dysfunction - unclear etiology - did not get recommended repeat echo - but she is quite adamant that this has been done - she does not wish to have further testing. She feels like she is doing well and only wants to get her medicines refilled.    2. HTN - recheck by me is 170/100 - I suspect she gets too much salt - will stop the Losartan - start Lisinopril 20 mg a day. Will get her a follow up in the HTN clinic.   3. Prior concern for TIA?CVA versus migraine - this seems resolved.   4. Iron def anemia on Iron - not discussed.    Current medicines are reviewed with the patient today.  The patient Has no concerns regarding medicines.   Current medicines are reviewed with the patient today.  The patient does not have concerns regarding medicines other than what has been noted above.  The following changes have been made:  See above.  Labs/ tests ordered today include:    Orders Placed This Encounter  Procedures  . Basic metabolic panel  . Hepatic function panel  . Lipid panel  . EKG 12-Lead     Disposition:   FU with Dr. Marlou Porch in 6 months.     Patient is agreeable to this plan and will call if any problems develop in the interim.   SignedTruitt Merle, NP  02/15/2018 3:05 PM  [facility] [address] [place], [place]  [address] Phone: [phone] Fax: [phone]

## 2018-02-15 NOTE — Patient Instructions (Addendum)
We will be checking the following labs today - BMET, CBC, HPF and lipids   Medication Instructions:    Continue with your current medicines. BUT  I have refilled the Toprol  I am stopping the Losartan  I am starting you on Lisinopril 20 mg a day - this is in the place of the Losartan and is at your pharmacy    Testing/Procedures To Be Arranged:  N/A  Follow-Up:   HTN clinic in 2 weeks  Dr. Marlou Porch in 6 months.     Other Special Instructions:   N/A    If you need a refill on your cardiac medications before your next appointment, please call your pharmacy.   Call the [place] office at [phone] if you have any questions, problems or concerns.

## 2018-02-16 LAB — BASIC METABOLIC PANEL
BUN/Creatinine Ratio: 12 (ref 9–23)
BUN: 9 mg/dL (ref 6–24)
CO2: 24 mmol/L (ref 20–29)
Calcium: 9.7 mg/dL (ref 8.7–10.2)
Chloride: 102 mmol/L (ref 96–106)
Creatinine, Ser: 0.76 mg/dL (ref 0.57–1.00)
GFR calc Af Amer: 107 mL/min/{1.73_m2} (ref >59)
GFR calc non Af Amer: 92 mL/min/{1.73_m2} (ref >59)
Glucose: 100 mg/dL — ABNORMAL HIGH (ref 65–99)
Potassium: 3.4 mmol/L — ABNORMAL LOW (ref 3.5–5.2)
Sodium: 140 mmol/L (ref 134–144)

## 2018-02-16 LAB — LIPID PANEL
Chol/HDL Ratio: 4.5 ratio — ABNORMAL HIGH (ref 0.0–4.4)
Cholesterol, Total: 186 mg/dL (ref 100–199)
HDL: 41 mg/dL (ref >39)
LDL Calculated: 70 mg/dL (ref 0–99)
Triglycerides: 373 mg/dL — ABNORMAL HIGH (ref 0–149)
VLDL Cholesterol Cal: 75 mg/dL — ABNORMAL HIGH (ref 5–40)

## 2018-02-16 LAB — HEPATIC FUNCTION PANEL
ALT: 16 IU/L (ref 0–32)
AST: 19 IU/L (ref 0–40)
Albumin: 4.2 g/dL (ref 3.5–5.5)
Alkaline Phosphatase: 77 IU/L (ref 39–117)
Bilirubin Total: 0.3 mg/dL (ref 0.0–1.2)
Bilirubin, Direct: 0.09 mg/dL (ref 0.00–0.40)
Total Protein: 7.6 g/dL (ref 6.0–8.5)

## 2018-02-20 ENCOUNTER — Other Ambulatory Visit: Payer: Self-pay | Admitting: *Deleted

## 2018-02-20 DIAGNOSIS — E876 Hypokalemia: Secondary | ICD-10-CM

## 2018-03-07 ENCOUNTER — Ambulatory Visit: Payer: BLUE CROSS/BLUE SHIELD

## 2018-03-08 ENCOUNTER — Other Ambulatory Visit: Payer: BLUE CROSS/BLUE SHIELD | Admitting: *Deleted

## 2018-03-08 ENCOUNTER — Ambulatory Visit (INDEPENDENT_AMBULATORY_CARE_PROVIDER_SITE_OTHER): Payer: BLUE CROSS/BLUE SHIELD | Admitting: Pharmacist

## 2018-03-08 VITALS — BP 158/100 | HR 64

## 2018-03-08 DIAGNOSIS — E876 Hypokalemia: Secondary | ICD-10-CM

## 2018-03-08 DIAGNOSIS — I10 Essential (primary) hypertension: Secondary | ICD-10-CM | POA: Diagnosis not present

## 2018-03-08 LAB — SPECIMEN STATUS

## 2018-03-08 MED ORDER — LISINOPRIL 40 MG PO TABS: 40.0000 mg | ORAL_TABLET | Freq: Every day | ORAL | 3 refills | Status: AC

## 2018-03-08 NOTE — Patient Instructions (Addendum)
If your labs are stable today, we will increase your lisinopril to 40mg  daily. You can take 2 of your 20mg  tablets until you run out, then pick up your new prescription for the 40mg  tablets and take 1 a day  Try to decrease your salt intake and start walking more frequently  Follow up in blood pressure clinic in 3 weeks

## 2018-03-08 NOTE — Progress Notes (Signed)
Patient ID: Patricia Perry                 DOB: 1965/12/12                      MRN: [id number]     HPI: Patricia Perry is a 52 y.o. female patient of Dr Marlou Porch referred by Truitt Merle, NP to HTN clinic. PMH is significant for HTN, LVEF 40-45% per 07/2016 echo, and prior concern for TIA vs migraine. She was seen by Cecille Rubin 3 weeks ago and BP was elevated at 180/100. Her losartan was switched to lisinopril due to recall and pt presents today for follow up.  Patient presents today in good spirits. She reports tolerating her lisinopril well. She denies dizziness, blurred vision, falls, or headaches. She does not have a BP cuff to monitor her BP at home. Reports she has not eaten much over the past few days due to illness. She has been more stressed recently due to her son living at home. She does not exercise and does not cook - eats all of her meals at restaurants, mostly [employer] and [employer]. She likes grilled chicken and broccoli however she admits to having a sweet tooth. She took her medications about 1 hour ago.  Current HTN meds: lisinopril 20mg  daily, Toprol 25mg  daily BP goal: <130/26mmHg  Family History: The patient's family history includes Cancer in her father; Diabetes in her mother; Healthy in her sister; Hypertension in her mother.   Social History: The patient  reports that she has never smoked. She has never used smokeless tobacco. She reports that she does not drink alcohol or use drugs.  Diet: Reports she eats everything. Likes grilled chicken and broccoli. Goes to [employer] - likes vegetables. Has a big sweet tooth. Does not add salt to food but does eat out at restaurants a lot. Drinks water with vinegar, water, and orange soda.    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  Home BP readings: Does not have a BP cuff to check.  Wt Readings from Last 3 Encounters:  02/15/18 220 lb  12.8 oz (100.2 kg)  10/27/16 202 lb (91.6 kg)  09/03/16 197 lb 8 oz (89.6 kg)   BP Readings from Last 3 Encounters:  02/15/18 (!) 180/100  10/27/16 (!) 162/91  09/03/16 138/90   Pulse Readings from Last 3 Encounters:  02/15/18 74  10/27/16 62  08/27/16 66    Renal function: CrCl cannot be calculated (Unknown ideal weight.).  Past Medical History:  Diagnosis Date  . Ejection fraction < 50%    per echo 07-29-2016  ef 40-45%  . Heavy menstrual bleeding   . History of herpes simplex type 2 infection    per pcp note 2016 tested positive  . Hypertension   . Iron deficiency anemia due to chronic blood loss dx 05/28/2015  Hg was 5.6/  last Hg 11.2 of 05/ 2017 in epic   severe per dr Alvy Bimler note ([facility])  iron supplement up to twice daily (per note pt not always compliant)  . Menorrhagia   . 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  . Transient visual loss of right eye    per pt this episode happened approx. 08/ 2017 with headachem took otc med. laid down went away;  pt pcp referred her  to neurologist (dr Jaynee Eagles) per note possbile tia vs migraine with aura and ordered stroke work-up, abnormal echo referred to cardiologist (dr Marlou Porch) , dx hypertension, ef 40-45%, put on bb and increased arb medication ---  MRI/MRA pending to determine tia/cva or migraine, pt cx'd   . Uterine fibroid   . Uterine polyp     Current Outpatient Medications on File Prior to Visit  Medication Sig Dispense Refill  . BIOTIN PO Take 1 capsule by mouth every evening.    Marland Kitchen lisinopril (PRINIVIL,ZESTRIL) 20 MG tablet Take 1 tablet (20 mg total) by mouth daily. 90 tablet 3  . metoprolol succinate (TOPROL-XL) 25 MG 24 hr tablet Take 1 tablet (25 mg total) by mouth daily. 90 tablet 3   No current facility-administered medications on file prior to visit.     No Known Allergies   Assessment/Plan:  1. Hypertension - BP mildly improved, however remains above goal <130/69mmHg due to dietary  indiscretion and inactivity. BMET stable today, will plan to increase lisinopril to 40mg  daily. Encouraged pt to start walking on a regular basis. She eats all of her meals at restaurants, we discussed the importance of a low sodium diet in controlling BP. Will f/u in HTN clinic in 3 weeks.   Amare Bail E. Dayona Shaheen, PharmD, CPP, [facility] [address] [place], [place] [address] Phone: [phone]; Fax: [phone] 03/08/2018 12:44 PM

## 2018-03-10 LAB — BASIC METABOLIC PANEL
BUN/Creatinine Ratio: 10 (ref 9–23)
BUN: 8 mg/dL (ref 6–24)
CO2: 24 mmol/L (ref 20–29)
Calcium: 9 mg/dL (ref 8.7–10.2)
Chloride: 100 mmol/L (ref 96–106)
Creatinine, Ser: 0.78 mg/dL (ref 0.57–1.00)
GFR calc Af Amer: 103 mL/min/{1.73_m2} (ref >59)
GFR calc non Af Amer: 90 mL/min/{1.73_m2} (ref >59)
Glucose: 90 mg/dL (ref 65–99)
Potassium: 3.8 mmol/L (ref 3.5–5.2)
Sodium: 138 mmol/L (ref 134–144)

## 2018-03-30 ENCOUNTER — Other Ambulatory Visit: Payer: BLUE CROSS/BLUE SHIELD

## 2018-03-30 ENCOUNTER — Ambulatory Visit: Payer: BLUE CROSS/BLUE SHIELD

## 2018-03-30 NOTE — Progress Notes (Deleted)
Patient ID: [name]                 DOB: [birth year]                      MRN: [id number]     HPI: Patricia Perry is a 52 y.o. female patient of Patricia Patricia Perry referred by Patricia Merle, NP to HTN clinic. PMH is significant for HTN, LVEF 40-45% per 07/2016 echo, and prior concern for TIA vs migraine. She was seen by Patricia Perry 3 weeks ago and BP was elevated at 158/100. Her lisinopril was increased to 40mg  daily and she was encouraged to increase activity and decrease dietary sodium.  Started walking? Decreasing sodium?  Eating any meals at home or still all at restaurants? Start chlorthalidone or amlodipine   Patient presents today in good spirits. She reports tolerating her lisinopril well. She denies dizziness, blurred vision, falls, or headaches. She does not have a BP cuff to monitor her BP at home. Reports she has not eaten much over the past few days due to illness. She has been more stressed recently due to her son living at home. She does not exercise and does not cook - eats all of her meals at restaurants, mostly [employer] and [employer]. She likes grilled chicken and broccoli however she admits to having a sweet tooth. She has a sedentary lifestyle. She took her medications about 1 hour ago.  Current HTN meds: lisinopril 40mg  daily, Toprol 25mg  daily BP goal: <130/47mmHg  Family History: The patient's family history includes Cancer in her father; Diabetes in her mother; Healthy in her sister; Hypertension in her mother.   Social History: The patient  reports that she has never smoked. She has never used smokeless tobacco. She reports that she does not drink alcohol or use drugs.  Diet: Reports she eats everything. Likes grilled chicken and broccoli. Goes to [employer] - likes vegetables. Has a big sweet tooth. Does not add salt to food but does eat out at restaurants a lot. Drinks water with vinegar, water, and orange soda.    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  Home BP readings: Does not have a BP cuff to check.  Wt Readings from Last 3 Encounters:  02/15/18 220 lb 12.8 oz (100.2 kg)  10/27/16 202 lb (91.6 kg)  09/03/16 197 lb 8 oz (89.6 kg)   BP Readings from Last 3 Encounters:  03/08/18 (!) 158/100  02/15/18 (!) 180/100  10/27/16 (!) 162/91   Pulse Readings from Last 3 Encounters:  03/08/18 64  02/15/18 74  10/27/16 62    Renal function: CrCl cannot be calculated (Patient's most recent lab result is older than the maximum 21 days allowed.).  Past Medical History:  Diagnosis Date  . Ejection fraction < 50%    per echo 07-29-2016  ef 40-45%  . Heavy menstrual bleeding   . History of herpes simplex type 2 infection    per pcp note 2016 tested positive  . Hypertension   . Iron deficiency anemia due to chronic blood loss dx 05/28/2015  Hg was 5.6/  last Hg 11.2 of 05/ 2017 in epic   severe per Patricia Perry note ([facility])  iron supplement up to twice daily (per note pt not always compliant)  . Menorrhagia   . Reactive thrombocytosis  secondary to iron def.  . Transient visual loss of right eye    per pt this episode happened approx. 08/ 2017 with headachem took otc med. laid down went away;  pt pcp referred her to neurologist (Patricia Perry) per note possbile tia vs migraine with aura and ordered stroke work-up, abnormal echo referred to cardiologist (Patricia Patricia Perry) , dx hypertension, ef 40-45%, put on bb and increased arb medication ---  MRI/MRA pending to determine tia/cva or migraine, pt cx'd   . Uterine fibroid   . Uterine polyp     Current Outpatient Medications on File Prior to Visit  Medication Sig Dispense Refill  . BIOTIN PO Take 1 capsule by mouth every evening.    Marland Kitchen lisinopril (PRINIVIL,ZESTRIL) 40 MG tablet Take 1 tablet (40 mg total) by mouth daily. 90 tablet 3  . metoprolol succinate (TOPROL-XL) 25 MG 24 hr tablet Take 1 tablet (25 mg  total) by mouth daily. 90 tablet 3   No current facility-administered medications on file prior to visit.     No Known Allergies   Assessment/Plan:  1. Hypertension - BP mildly improved, however remains above goal <130/49mmHg due to dietary indiscretion and inactivity. BMET stable today, will plan to increase lisinopril to 40mg  daily. Encouraged pt to start walking on a regular basis. She eats all of her meals at restaurants, we discussed the importance of a low sodium diet in controlling BP. Will f/u in HTN clinic in 3 weeks.   Patricia Perry, PharmD, CPP, [facility] [address], [place], [place] [address] Phone: [phone]; Fax: [phone] 03/30/2018 7:46 AM

## 2018-04-21 ENCOUNTER — Other Ambulatory Visit: Payer: BLUE CROSS/BLUE SHIELD

## 2018-04-21 ENCOUNTER — Ambulatory Visit: Payer: BLUE CROSS/BLUE SHIELD

## 2018-05-15 ENCOUNTER — Other Ambulatory Visit: Payer: BLUE CROSS/BLUE SHIELD | Admitting: *Deleted

## 2018-05-15 ENCOUNTER — Ambulatory Visit (INDEPENDENT_AMBULATORY_CARE_PROVIDER_SITE_OTHER): Payer: BLUE CROSS/BLUE SHIELD | Admitting: Pharmacist

## 2018-05-15 VITALS — BP 166/100 | HR 75

## 2018-05-15 DIAGNOSIS — I10 Essential (primary) hypertension: Secondary | ICD-10-CM | POA: Diagnosis not present

## 2018-05-15 MED ORDER — METOPROLOL SUCCINATE ER 50 MG PO TB24: 50.0000 mg | ORAL_TABLET | Freq: Every day | ORAL | 11 refills | Status: DC

## 2018-05-15 MED ORDER — SPIRONOLACTONE 25 MG PO TABS: 25.0000 mg | ORAL_TABLET | Freq: Every day | ORAL | 11 refills | Status: DC

## 2018-05-15 NOTE — Progress Notes (Signed)
Patient ID: Patricia Perry                 DOB: October 14, 1966                      MRN: [id number]     HPI: GREG ECKRICH is a 52 y.o. female patient of Dr Marlou Porch referred by Truitt Merle, NP to HTN clinic. PMH is significant for HTN, LVEF 40-45% per 07/2016 echo, and prior concern for TIA vs migraine. She was seen by Cecille Rubin 3 weeks ago and BP was elevated at 180/100. Her losartan was switched to lisinopril due to recall. At last HTN visit, lisinopril dose was increased to 40mg  daily. Discussed dietary indiscretion and inactivity contributing towards high BP as well.  Patient presents today in good spirits. She reports tolerating her higher dose of lisinopril well. She denies dizziness, blurred vision, falls, or headaches. She does not have a BP cuff to monitor her BP at home. She has been more stressed recently due to putting her mother in a home. She does not exercise and does not cook - eats all of her meals at restaurants, mostly [employer], [employer]. She likes grilled chicken and broccoli however she admits to having a sweet tooth. Likes [employer] ice cream and potato chips. She has been taking a K supplement OTC.  Current HTN meds: lisinopril 40mg  daily, Toprol 25mg  daily BP goal: <130/56mmHg  Family History: The patient's family history includes Cancer in her father; Diabetes in her mother; Healthy in her sister; Hypertension in her mother.   Social History: The patient  reports that she has never smoked. She has never used smokeless tobacco. She reports that she does not drink alcohol or use drugs.  Diet: Reports she eats everything. Likes grilled chicken and broccoli. Goes to [employer] - likes vegetables. Has a big sweet tooth. Does not add salt to food but does eat out at restaurants a lot. Drinks water with vinegar, water, and orange soda.    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  Home BP readings: Does not have a BP cuff to check.  Wt Readings from Last 3 Encounters:  02/15/18 220 lb 12.8 oz (100.2 kg)  10/27/16 202 lb (91.6 kg)  09/03/16 197 lb 8 oz (89.6 kg)   BP Readings from Last 3 Encounters:  03/08/18 (!) 158/100  02/15/18 (!) 180/100  10/27/16 (!) 162/91   Pulse Readings from Last 3 Encounters:  03/08/18 64  02/15/18 74  10/27/16 62    Renal function: CrCl cannot be calculated (Patient's most recent lab result is older than the maximum 21 days allowed.).  Past Medical History:  Diagnosis Date  . Ejection fraction < 50%    per echo 07-29-2016  ef 40-45%  . Heavy menstrual bleeding   . History of herpes simplex type 2 infection    per pcp note 2016 tested positive  . Hypertension   . Iron deficiency anemia due to chronic blood loss dx 05/28/2015  Hg was 5.6/  last Hg 11.2 of 05/ 2017 in epic   severe per dr Alvy Bimler note ([facility])  iron supplement up to twice daily (per note pt not always compliant)  . Menorrhagia   . 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  . Transient visual loss of right eye  per pt this episode happened approx. 08/ 2017 with headachem took otc med. laid down went away;  pt pcp referred her to neurologist (dr Jaynee Eagles) per note possbile tia vs migraine with aura and ordered stroke work-up, abnormal echo referred to cardiologist (dr Marlou Porch) , dx hypertension, ef 40-45%, put on bb and increased arb medication ---  MRI/MRA pending to determine tia/cva or migraine, pt cx'd   . Uterine fibroid   . Uterine polyp     Current Outpatient Medications on File Prior to Visit  Medication Sig Dispense Refill  . BIOTIN PO Take 1 capsule by mouth every evening.    Marland Kitchen lisinopril (PRINIVIL,ZESTRIL) 40 MG tablet Take 1 tablet (40 mg total) by mouth daily. 90 tablet 3  . metoprolol succinate (TOPROL-XL) 25 MG 24 hr tablet Take 1 tablet (25 mg total) by mouth daily. 90 tablet 3   No current facility-administered  medications on file prior to visit.     No Known Allergies   Assessment/Plan:  1. Hypertension - BP remains above goal <130/77mmHg due to dietary indiscretion and inactivity. Checking BMET today on higher dose of lisinopril. Will plan to increase Toprol to 50mg  daily and start spironolactone 25mg  daily. Again encouraged pt to start walking on a regular basis. She eats all of her meals at restaurants. Discussed reading nutrition labels when purchasing snacks at the grocery store and picking low sodium options when she eats at restaurants. Will f/u in HTN clinic in 2 weeks for BP check and BMET.   Megan E. Supple, PharmD, CPP, [facility] [address], [place], [place] [address] Phone: [phone]; Fax: [phone] 05/15/2018 7:20 AM

## 2018-05-15 NOTE — Patient Instructions (Addendum)
Increase your metoprolol from 25mg  to 50mg  once a day  Start taking spironolactone 25mg  once a day  Try to limit your sodium (salt) intake to less than 2,000mg  each day. Read your nutrition labels when you're at the store  Look for [employer] ice cream instead of Doristine Mango  Start walking 20 minutes most days a week  Follow up in clinic in 2-3 weeks for a blood pressure check

## 2018-05-16 LAB — BASIC METABOLIC PANEL
BUN/Creatinine Ratio: 13 (ref 9–23)
BUN: 11 mg/dL (ref 6–24)
CO2: 22 mmol/L (ref 20–29)
Calcium: 9.9 mg/dL (ref 8.7–10.2)
Chloride: 105 mmol/L (ref 96–106)
Creatinine, Ser: 0.83 mg/dL (ref 0.57–1.00)
GFR calc Af Amer: 96 mL/min/{1.73_m2} (ref >59)
GFR calc non Af Amer: 83 mL/min/{1.73_m2} (ref >59)
Glucose: 81 mg/dL (ref 65–99)
Potassium: 4.1 mmol/L (ref 3.5–5.2)
Sodium: 144 mmol/L (ref 134–144)

## 2018-06-01 ENCOUNTER — Ambulatory Visit (INDEPENDENT_AMBULATORY_CARE_PROVIDER_SITE_OTHER): Payer: BLUE CROSS/BLUE SHIELD | Admitting: Pharmacist

## 2018-06-01 VITALS — BP 164/102 | HR 78

## 2018-06-01 DIAGNOSIS — I10 Essential (primary) hypertension: Secondary | ICD-10-CM

## 2018-06-01 NOTE — Progress Notes (Signed)
Patient ID: Patricia Perry                 DOB: [birth year]                      MRN: [id number]     HPI: Patricia Perry is a 52 y.o. female patient of Dr Marlou Porch referred by Truitt Merle, NP to HTN clinic. PMH is significant for HTN, LVEF 40-45% per 07/2016 echo, and prior concern for TIA vs migraine.  Dietary indiscretion and inactivity have contributed towards high BP. At last HTN visit 2 weeks ago, BP was elevated at 166/100. Toprol was increased to 50mg  daily and spironolactone 25mg  daily was started. Encouraged pt to start walking on a regular basis.   Patient presents today in good spirits. She reports tolerating her medications well. She denies dizziness, blurred vision, falls, or headaches. She does not have a BP cuff to monitor her BP at home. She has been more stressed recently due to putting her mother in a home. She does not exercise and does not cook - eats all of her meals at restaurants, mostly [employer], [employer], and [employer]. She likes grilled chicken and broccoli however she admits to having a sweet tooth. Likes Haagen Dazs ice cream and potato chips. She did stop taking her K supplement as instructed at last visit.  She usually takes her BP medications in the AM however took them 7 hours late today, about 10 minutes prior to office visit today. She has not been to the grocery store since her last visit and continues to eat every meal at restaurants. She does look for healthy options like baked fish and vegetables, however we have discussed that restaurant food is typically much higher in sodium than food prepared at home. We have reviewed nutrition labels as well. She does not do any formal exercise other than some walking at work. She is not motivated to begin exercising.  Current HTN meds: lisinopril 40mg  daily, Toprol 50mg  daily, spironolactone 25mg  daily BP goal: <130/54mmHg  Family History: The patient's family history includes Cancer in her father; Diabetes in her mother;  Healthy in her sister; Hypertension in her mother.   Social History: The patient  reports that she has never smoked. She has never used smokeless tobacco. She reports that she does not drink alcohol or use drugs.  Diet: Reports she eats everything. Likes grilled chicken and broccoli. Goes to [employer] - likes vegetables. Has a big sweet tooth. Does not add salt to food but does eat out at restaurants a lot. Drinks water with vinegar, water, and orange soda.    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  Home BP readings: Does not have a BP cuff to check.  Wt Readings from Last 3 Encounters:  02/15/18 220 lb 12.8 oz (100.2 kg)  10/27/16 202 lb (91.6 kg)  09/03/16 197 lb 8 oz (89.6 kg)   BP Readings from Last 3 Encounters:  05/15/18 (!) 166/100  03/08/18 (!) 158/100  02/15/18 (!) 180/100   Pulse Readings from Last 3 Encounters:  05/15/18 75  03/08/18 64  02/15/18 74    Renal function: CrCl cannot be calculated (Unknown ideal weight.).  Past Medical History:  Diagnosis Date  . Ejection fraction < 50%    per echo 07-29-2016  ef 40-45%  . Heavy menstrual bleeding   .  History of herpes simplex type 2 infection    per pcp note 2016 tested positive  . Hypertension   . Iron deficiency anemia due to chronic blood loss dx 05/28/2015  Hg was 5.6/  last Hg 11.2 of 05/ 2017 in epic   severe per dr Alvy Bimler note ([facility])  iron supplement up to twice daily (per note pt not always compliant)  . Menorrhagia   . 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  . Transient visual loss of right eye    per pt this episode happened approx. 08/ 2017 with headachem took otc med. laid down went away;  pt pcp referred her to neurologist (dr Jaynee Eagles) per note possbile tia vs migraine with aura and ordered stroke work-up, abnormal echo referred to cardiologist (dr Marlou Porch) , dx hypertension, ef  40-45%, put on bb and increased arb medication ---  MRI/MRA pending to determine tia/cva or migraine, pt cx'd   . Uterine fibroid   . Uterine polyp     Current Outpatient Medications on File Prior to Visit  Medication Sig Dispense Refill  . BIOTIN PO Take 1 capsule by mouth every evening.    Marland Kitchen lisinopril (PRINIVIL,ZESTRIL) 40 MG tablet Take 1 tablet (40 mg total) by mouth daily. 90 tablet 3  . metoprolol succinate (TOPROL-XL) 50 MG 24 hr tablet Take 1 tablet (50 mg total) by mouth daily. 30 tablet 11  . spironolactone (ALDACTONE) 25 MG tablet Take 1 tablet (25 mg total) by mouth daily. 30 tablet 11   No current facility-administered medications on file prior to visit.     No Known Allergies   Assessment/Plan:  1. Hypertension - BP remains above goal <130/71mmHg due to dietary indiscretion, inactivity, and administration of BP medications 10 minutes prior to office visit today. Checking BMET today on new start spironolactone. Pt is resistant to making any medication dose changes today because she does not like taking medications. She prefers to continue on current meds for another 2 weeks prior to making any changes. Will call pt in 2 weeks and see if she is willing to increase Toprol to 100mg  daily and increase spironolactone to 50mg  daily pending BMET results from today. Advised pt to check her BP at local pharmacy a few times within the next 2 weeks. Will f/u in HTN clinic in 4 weeks.  2. Lifestyle - Pt is sedentary and east at restaurants 100% of the time. We have discussed reading nutrition labels when purchasing snacks at the grocery store and picking low sodium options when she eats at restaurants. Had pt download [facility] app on her phone so that she can start tracking her steps each day with goal of 10,000 steps each day.   Tionne Carelli E. Elo Marmolejos, PharmD, CPP, [place] [address] [place], [place] [address] Phone: [phone]; Fax: [phone] 06/01/2018 3:02 PM

## 2018-06-01 NOTE — Patient Instructions (Addendum)
It was nice to see you today  Your blood pressure is above goal < 130/80  We will check your labs today  I would like to increase your metoprolol from 50mg  to 100mg  once a day  I would like to increase your spironolactone from 25mg  to 50mg  once a day  Continue taking your lisinopril  Follow up in 4 weeks in clinic. I will call you in 2 weeks to see if you are willing to make these medication changes  Check your blood pressure at the local pharmacy in the mean time   Use your phone app to track your steps each day - your goal is 10,000 steps each day  Read nutrition labels at the grocery store - limit sodium intake to less than 2,000mg  every day  Look for lower fat ice cream alternatives to [place] [employer]

## 2018-06-02 LAB — BASIC METABOLIC PANEL
BUN/Creatinine Ratio: 11 (ref 9–23)
BUN: 9 mg/dL (ref 6–24)
CALCIUM: 9.5 mg/dL (ref 8.7–10.2)
CHLORIDE: 104 mmol/L (ref 96–106)
CO2: 24 mmol/L (ref 20–29)
Creatinine, Ser: 0.79 mg/dL (ref 0.57–1.00)
GFR calc Af Amer: 102 mL/min/{1.73_m2} (ref >59)
GFR calc non Af Amer: 88 mL/min/{1.73_m2} (ref >59)
Glucose: 79 mg/dL (ref 65–99)
POTASSIUM: 3.9 mmol/L (ref 3.5–5.2)
Sodium: 140 mmol/L (ref 134–144)

## 2018-06-26 ENCOUNTER — Telehealth: Payer: Self-pay | Admitting: Pharmacist

## 2018-06-26 NOTE — Telephone Encounter (Signed)
Followed up with pt to see if she is willing to increase her metoprolol and spironolactone as discussed at last visit. She states she is not willing to do so until she has her blood pressure checked again. She has not checked her blood pressure at all since last visit. Encouraged pt to go to a pharmacy and have her BP checked and to keep her f/u appt later this week. Will attempt to increase both meds again at that time.

## 2018-06-29 ENCOUNTER — Ambulatory Visit (INDEPENDENT_AMBULATORY_CARE_PROVIDER_SITE_OTHER): Payer: BLUE CROSS/BLUE SHIELD | Admitting: Pharmacist

## 2018-06-29 VITALS — BP 150/92 | HR 73

## 2018-06-29 DIAGNOSIS — I10 Essential (primary) hypertension: Secondary | ICD-10-CM

## 2018-06-29 MED ORDER — SPIRONOLACTONE 50 MG PO TABS: 50.0000 mg | ORAL_TABLET | Freq: Every day | ORAL | 11 refills | Status: AC

## 2018-06-29 MED ORDER — METOPROLOL SUCCINATE ER 100 MG PO TB24: 100.0000 mg | ORAL_TABLET | Freq: Every day | ORAL | 11 refills | Status: AC

## 2018-06-29 NOTE — Patient Instructions (Addendum)
Increase your spironolactone to 50mg  daily  Increase your metoprolol to 100mg  daily  Continue taking lisinopril 40mg  daily  Try to increase your walking to 10,000 steps per day  Try to eat meals at home  Check your blood pressure at the pharmacy and record your readings  Your blood pressure goal is < 130/3mmHg  Recheck labs in 2 weeks - Thursday Sept 26th  Follow up in clinic in 4 weeks for blood pressure check - Thursday Octo 10 at 4pm

## 2018-06-29 NOTE — Progress Notes (Signed)
Patient ID: Patricia Perry                 DOB: 1966/08/10                      MRN: [id number]     HPI: Patricia Perry is a 52 y.o. female patient of Dr Marlou Porch referred by Truitt Merle, NP to HTN clinic. PMH is significant for HTN, LVEF 40-45% per 07/2016 echo, and prior concern for TIA vs migraine.  Dietary indiscretion and inactivity have contributed towards high BP. At last HTN visit 2 weeks ago, BP was elevated at 166/100. Toprol was increased to 50mg  daily and spironolactone 25mg  daily was started. Encouraged pt to start walking on a regular basis.   Patient presents today in good spirits. She reports tolerating her medications well. She denies dizziness, blurred vision, falls, or headaches. She does not have a BP cuff to monitor her BP at home. She was advised at last visit to check BP at the pharmacy but has not done so. Pt downloaded a step tracker on her phone at her last visit in clinic. She has been walking 2,000-6,000 steps per day, does not think she can get to 10,000. She has been more stressed recently due to putting her mother in a home. She does not exercise and does not cook - eats all of her meals at restaurants, mostly [employer], [employer], and [employer]. She likes grilled chicken and broccoli however she admits to having a sweet tooth. Likes Haagen [employer]azs ice cream and potato chips.  She has not been to the grocery store since her last visit and continues to eat every meal at restaurants. She does look for healthy options like baked fish and vegetables, however we have discussed that restaurant food is typically much higher in sodium than food prepared at home. We have reviewed nutrition labels as well. She does not do any formal exercise other than some walking at work. She is not motivated to begin exercising.  Current HTN meds: lisinopril 40mg  daily, Toprol 50mg  daily, spironolactone 25mg  daily BP goal: <130/58mmHg  Family History: The patient's family history includes Cancer in  her father; Diabetes in her mother; Healthy in her sister; Hypertension in her mother.   Social History: The patient  reports that she has never smoked. She has never used smokeless tobacco. She reports that she does not drink alcohol or use drugs.  Diet: Reports she eats everything. Likes grilled chicken and broccoli. Goes to [employer] - likes vegetables. Has a big sweet tooth. Does not add salt to food but does eat out at restaurants a lot. Drinks water with vinegar, water, and orange soda.    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  Home BP readings: Does not have a BP cuff to check.  Wt Readings from Last 3 Encounters:  02/15/18 220 lb 12.8 oz (100.2 kg)  10/27/16 202 lb (91.6 kg)  09/03/16 197 lb 8 oz (89.6 kg)   BP Readings from Last 3 Encounters:  06/01/18 (!) 164/102  05/15/18 (!) 166/100  03/08/18 (!) 158/100   Pulse Readings from Last 3 Encounters:  06/01/18 78  05/15/18 75  03/08/18 64    Renal function: CrCl cannot be calculated (Patient's most recent lab result is older than the maximum 21 days allowed.).  Past Medical History:  Diagnosis  Date  . Ejection fraction < 50%    per echo 07-29-2016  ef 40-45%  . Heavy menstrual bleeding   . History of herpes simplex type 2 infection    per pcp note 2016 tested positive  . Hypertension   . Iron deficiency anemia due to chronic blood loss dx 05/28/2015  Hg was 5.6/  last Hg 11.2 of 05/ 2017 in epic   severe per dr Alvy Bimler note ([facility])  iron supplement up to twice daily (per note pt not always compliant)  . Menorrhagia   . 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  . Transient visual loss of right eye    per pt this episode happened approx. 08/ 2017 with headachem took otc med. laid down went away;  pt pcp referred her to neurologist (dr Jaynee Eagles) per note possbile tia vs migraine with aura and ordered  stroke work-up, abnormal echo referred to cardiologist (dr Marlou Porch) , dx hypertension, ef 40-45%, put on bb and increased arb medication ---  MRI/MRA pending to determine tia/cva or migraine, pt cx'd   . Uterine fibroid   . Uterine polyp     Current Outpatient Medications on File Prior to Visit  Medication Sig Dispense Refill  . BIOTIN PO Take 1 capsule by mouth every evening.    Marland Kitchen lisinopril (PRINIVIL,ZESTRIL) 40 MG tablet Take 1 tablet (40 mg total) by mouth daily. 90 tablet 3  . metoprolol succinate (TOPROL-XL) 50 MG 24 hr tablet Take 1 tablet (50 mg total) by mouth daily. 30 tablet 11  . spironolactone (ALDACTONE) 25 MG tablet Take 1 tablet (25 mg total) by mouth daily. 30 tablet 11   No current facility-administered medications on file prior to visit.     No Known Allergies   Assessment/Plan:  1. Hypertension - BP remains above goal <130/61mmHg secondary to dietary indiscretion and inactivity. Will increase Toprol to 100mg  daily and increase spironolactone to 50mg  daily. Continue lisinopril 40mg  daily. Advised pt to check her BP at local pharmacy a few times within the next 2 weeks. Will f/u BMET in 2 weeks and in HTN clinic in 4 weeks.  2. Lifestyle - Pt is sedentary and east at restaurants 100% of the time. We have discussed reading nutrition labels when purchasing snacks at the grocery store and picking low sodium options when she eats at restaurants. Pt will continue to monitor daily steps using [place] on her phone - goal of 10,000 steps each day.   Raynard Mapps E. Marishka Rentfrow, PharmD, CPP, [place] [address] [place], [place] [address] Phone: [phone]; Fax: [phone] 06/29/2018 1:24 PM

## 2018-07-13 ENCOUNTER — Other Ambulatory Visit: Payer: BLUE CROSS/BLUE SHIELD

## 2018-07-18 ENCOUNTER — Other Ambulatory Visit: Payer: BLUE CROSS/BLUE SHIELD

## 2018-07-18 DIAGNOSIS — I10 Essential (primary) hypertension: Secondary | ICD-10-CM

## 2018-07-19 LAB — BASIC METABOLIC PANEL
BUN/Creatinine Ratio: 10 (ref 9–23)
BUN: 9 mg/dL (ref 6–24)
CALCIUM: 10.1 mg/dL (ref 8.7–10.2)
CHLORIDE: 100 mmol/L (ref 96–106)
CO2: 24 mmol/L (ref 20–29)
Creatinine, Ser: 0.91 mg/dL (ref 0.57–1.00)
GFR calc Af Amer: 86 mL/min/{1.73_m2} (ref >59)
GFR calc non Af Amer: 74 mL/min/{1.73_m2} (ref >59)
Glucose: 94 mg/dL (ref 65–99)
Potassium: 3.6 mmol/L (ref 3.5–5.2)
Sodium: 140 mmol/L (ref 134–144)

## 2018-07-20 ENCOUNTER — Other Ambulatory Visit: Payer: BLUE CROSS/BLUE SHIELD

## 2018-07-27 ENCOUNTER — Ambulatory Visit: Payer: BLUE CROSS/BLUE SHIELD

## 2018-07-27 NOTE — Progress Notes (Deleted)
Patient ID: AMEE BOOTHE                 DOB: [birth year]                      MRN: [id number]     HPI: Patricia Perry is a 52 y.o. female patient of Dr Marlou Porch referred by Truitt Merle, NP to HTN clinic. PMH is significant for HTN, LVEF 40-45% per 07/2016 echo, and prior concern for TIA vs migraine.  Dietary indiscretion and inactivity have contributed towards high BP. At last HTN visit she was encouraged to purchase healthy snacks from grocery store and increase step count per day. Her Toprol and spironolactone were also increased. BMET after dose increase was stable.   Patient presents today in good spirits.   Current HTN meds: lisinopril 40mg  daily, Toprol 100mg  daily, spironolactone 50mg  daily BP goal: <130/69mmHg  Family History: The patient's family history includes Cancer in her father; Diabetes in her mother; Healthy in her sister; Hypertension in her mother.   Social History: The patient  reports that she has never smoked. She has never used smokeless tobacco. She reports that she does not drink alcohol or use drugs.  Diet: Reports she eats everything. Likes grilled chicken and broccoli. Goes to [employer] - likes vegetables. Has a big sweet tooth. Does not add salt to food but does eat out at restaurants a lot. Drinks water with vinegar, water, and orange soda.    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  Home BP readings: Does not have a BP cuff to check.  Wt Readings from Last 3 Encounters:  02/15/18 220 lb 12.8 oz (100.2 kg)  10/27/16 202 lb (91.6 kg)  09/03/16 197 lb 8 oz (89.6 kg)   BP Readings from Last 3 Encounters:  06/29/18 (!) 150/92  06/01/18 (!) 164/102  05/15/18 (!) 166/100   Pulse Readings from Last 3 Encounters:  06/29/18 73  06/01/18 78  05/15/18 75    Renal function: CrCl cannot be calculated (Unknown ideal weight.).  Past Medical History:  Diagnosis Date   . Ejection fraction < 50%    per echo 07-29-2016  ef 40-45%  . Heavy menstrual bleeding   . History of herpes simplex type 2 infection    per pcp note 2016 tested positive  . Hypertension   . Iron deficiency anemia due to chronic blood loss dx 05/28/2015  Hg was 5.6/  last Hg 11.2 of 05/ 2017 in epic   severe per dr Alvy Bimler note ([facility])  iron supplement up to twice daily (per note pt not always compliant)  . Menorrhagia   . 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  . Transient visual loss of right eye    per pt this episode happened approx. 08/ 2017 with headachem took otc med. laid down went away;  pt pcp referred her to neurologist (dr Jaynee Eagles) per note possbile tia vs migraine with aura and ordered stroke work-up, abnormal echo referred to cardiologist (dr Marlou Porch) , dx hypertension, ef 40-45%, put on bb and increased arb medication ---  MRI/MRA pending to determine tia/cva or migraine, pt cx'd   . Uterine fibroid   . Uterine polyp     Current Outpatient Medications on File Prior to Visit  Medication Sig Dispense Refill  . BIOTIN PO Take  1 capsule by mouth every evening.    Marland Kitchen lisinopril (PRINIVIL,ZESTRIL) 40 MG tablet Take 1 tablet (40 mg total) by mouth daily. 90 tablet 3  . metoprolol succinate (TOPROL-XL) 100 MG 24 hr tablet Take 1 tablet (100 mg total) by mouth daily. 30 tablet 11  . spironolactone (ALDACTONE) 50 MG tablet Take 1 tablet (50 mg total) by mouth daily. 30 tablet 11   No current facility-administered medications on file prior to visit.     No Known Allergies   Assessment/Plan:  1. Hypertension - BP remains above goal <130/34mmHg secondary to dietary indiscretion and inactivity.   2. Lifestyle -    Thank you, [name], [place]  [address] [place], [place] [address]  Phone: [phone]; Fax: [phone] 07/27/2018 10:26 AM

## 2018-08-05 IMAGING — MG 2D DIGITAL DIAGNOSTIC BILATERAL MAMMOGRAM WITH CAD AND ADJUNCT T
8 of 12 series · 8 of 28 positions shown · non-contrast
Comparison: Previous exam(s).

CLINICAL DATA: Followup left breast probable fibroadenoma.

EXAM:
2D DIGITAL DIAGNOSTIC BILATERAL MAMMOGRAM WITH CAD AND ADJUNCT TOMO
ULTRASOUND LEFT BREAST

[R MLO synth-2D]
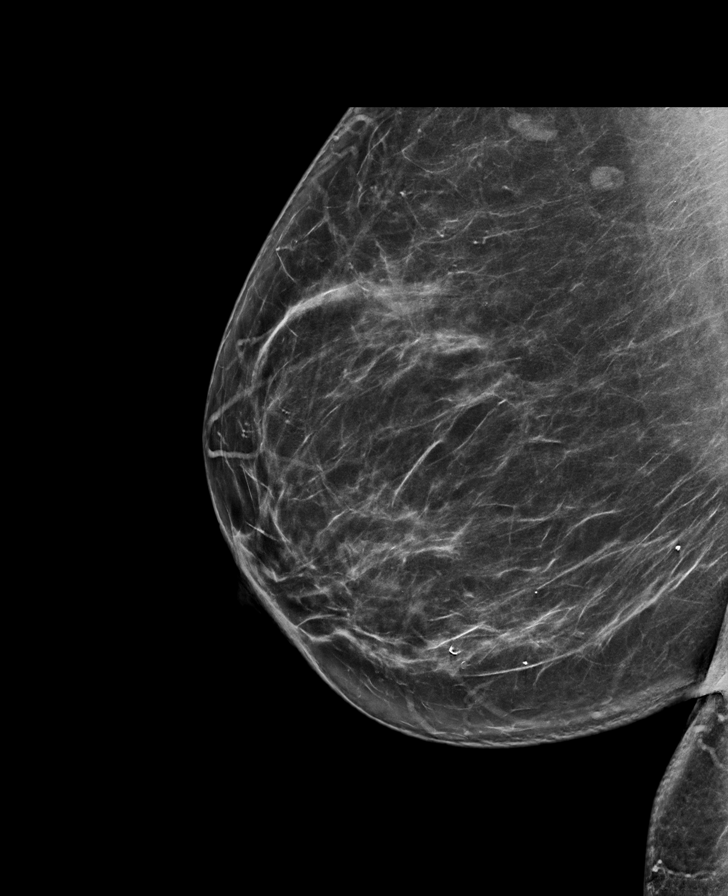

[L MLO synth-2D]
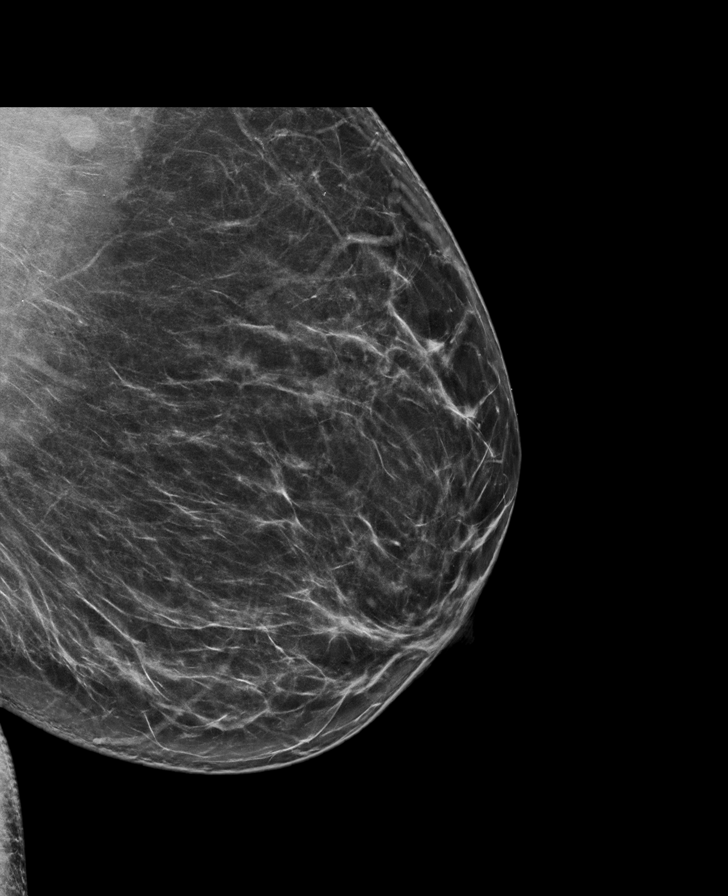

[R CC]
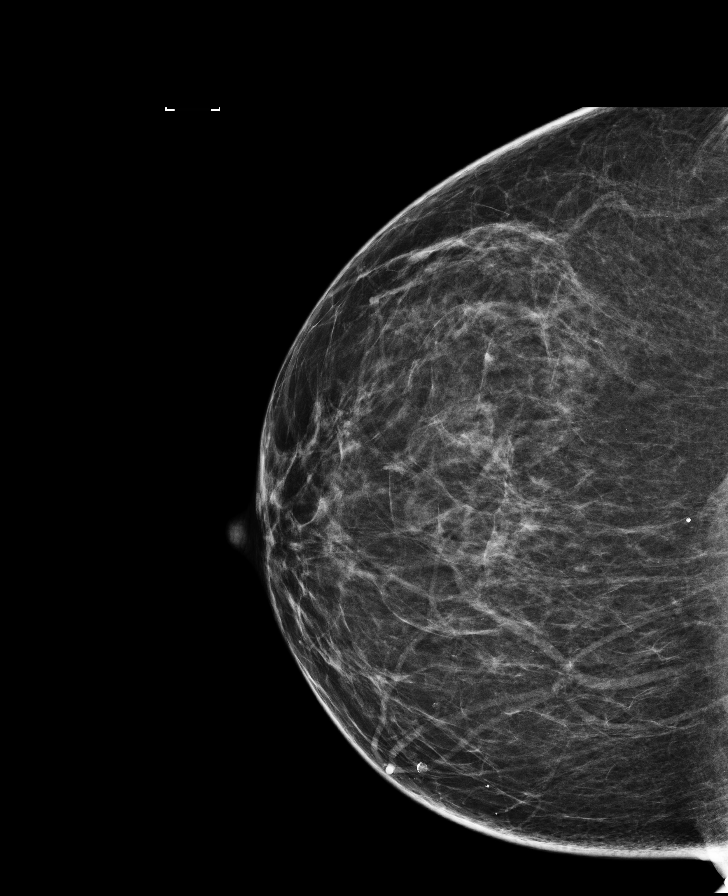

[L CC]
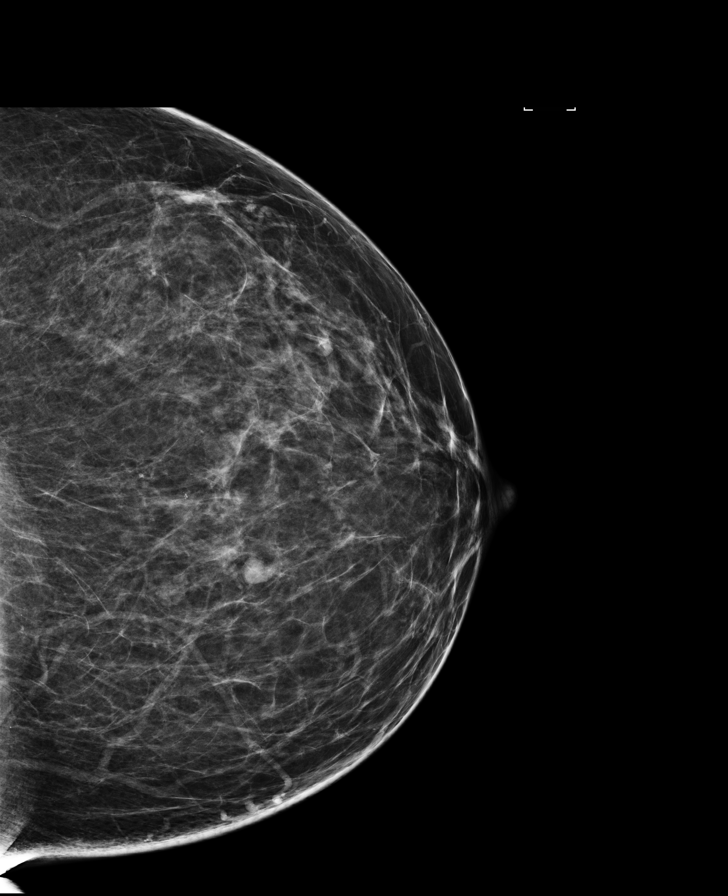

[R CC synth-2D]
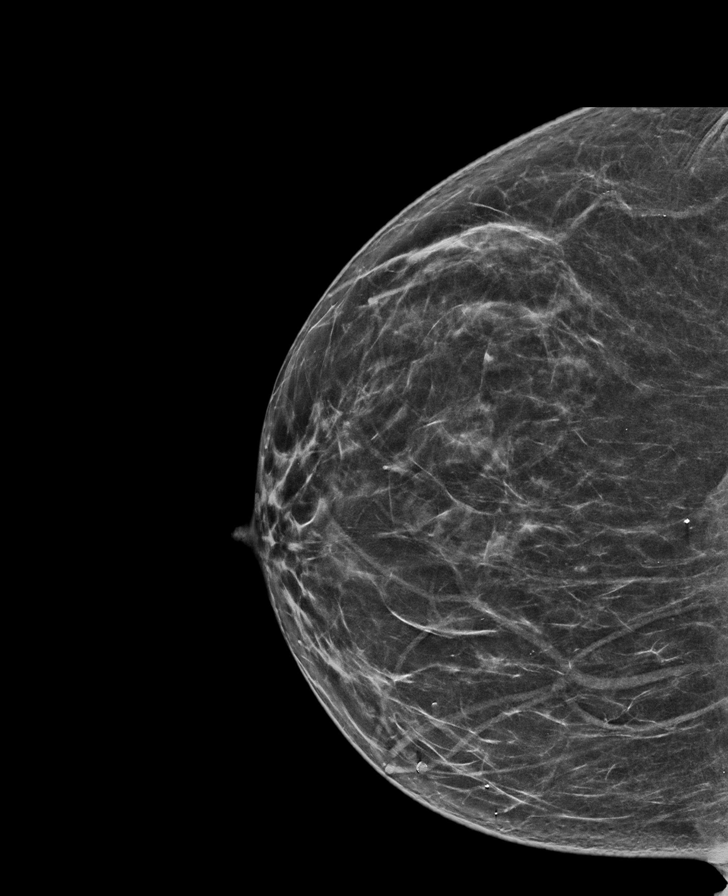

[L CC synth-2D]
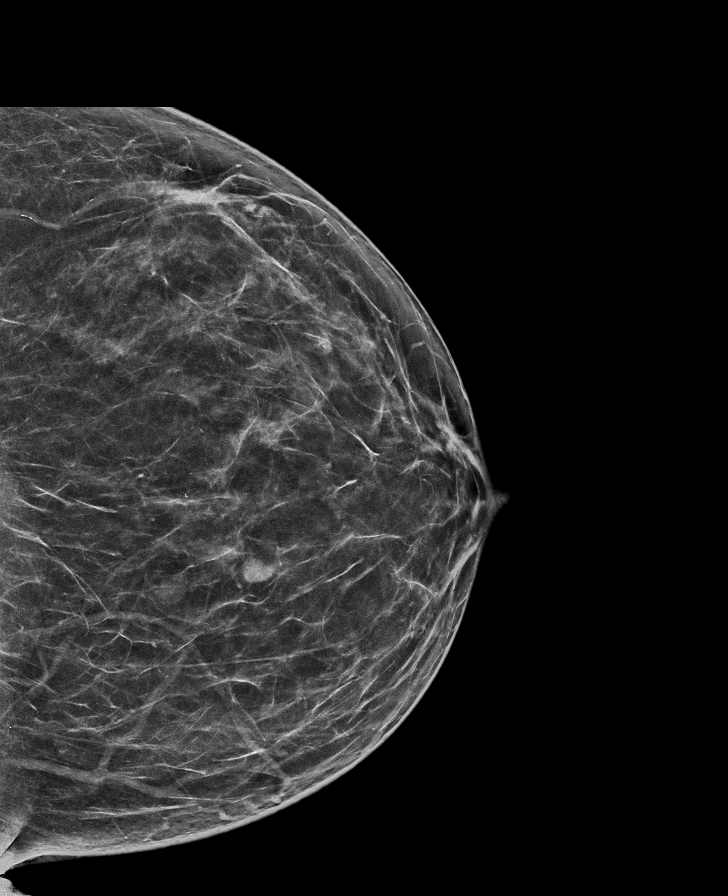

[R MLO]
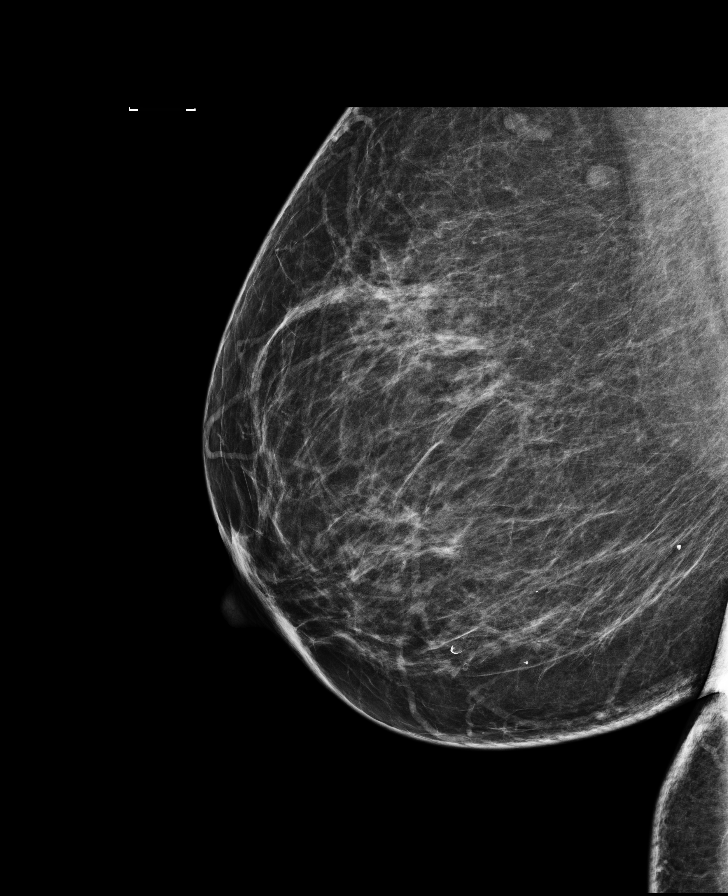

[L MLO]
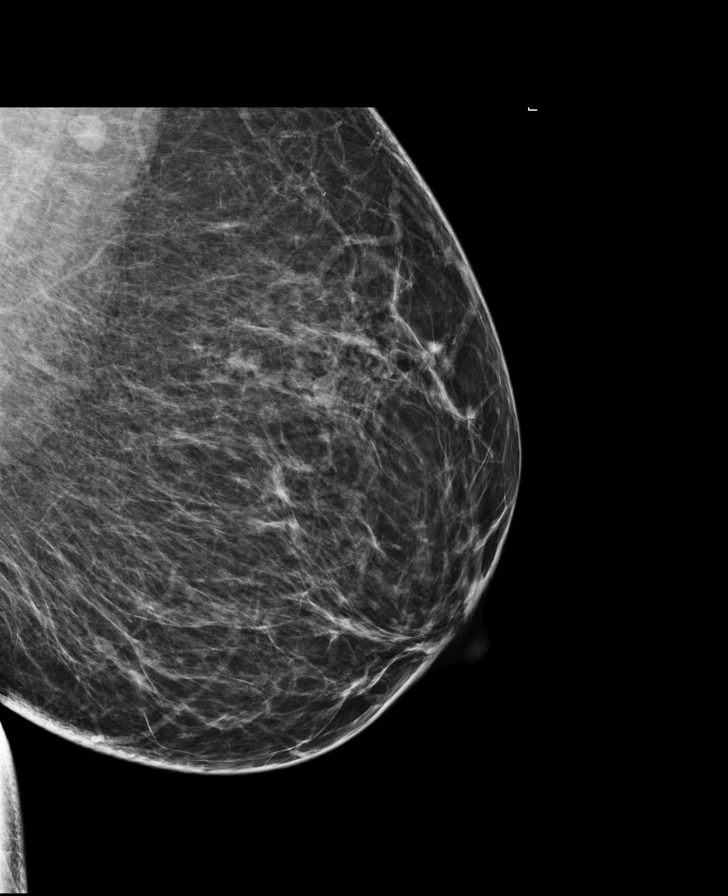

[8 of 28 positions shown; findings below may reference images not displayed]

ACR Breast Density Category b: There are scattered areas of
fibroglandular density.
FINDINGS: 2D and 3D tomographic images of both breasts were obtained. The
images of the left breast demonstrate no significant change in a
small, oval, circumscribed mass in the upper inner quadrant. More
posteriorly in that portion of the breast, there is a similar-sized,
circumscribed, multilobulated mass. This mass is slightly larger and
better visualized than previously. No findings elsewhere in either
breast suspicious for malignancy.

Mammographic images were processed with CAD.

On physical exam, no mass is palpable in the upper inner left
breast.

Targeted ultrasound is performed, showing a 9 x 8 x 7 mm oval,
horizontally oriented, hypoechoic mass in the 11:30 o'clock position
of the left breast, 4 cm from the nipple. This measured 10 x 10 x 7
mm on 06/25/2015.

There is a nearby cluster of cysts measuring 8 x 5 x 4 mm in maximum
dimensions. This contains some internal echoes with no internal
blood flow seen with power Doppler. This corresponds to the second
mass seen mammographically.
IMPRESSION: 1. Interval mild decrease in size of the previously demonstrated
fibroadenoma or complicated cyst in the 11:30 o'clock position of
the left breast.
2. Mild increase in size of a cluster of cysts in the 11:30 o'clock
position of the left breast.
3. No evidence of malignancy in either breast.

RECOMMENDATION:
Bilateral screening mammogram in 1 year.

I have discussed the findings and recommendations with the patient.
Results were also provided in writing at the conclusion of the
visit. If applicable, a reminder letter will be sent to the patient
regarding the next appointment.

BI-RADS CATEGORY  2: Benign.

## 2018-08-17 ENCOUNTER — Ambulatory Visit: Payer: BLUE CROSS/BLUE SHIELD

## 2018-08-17 NOTE — Progress Notes (Deleted)
Patient ID: Patricia Perry                 DOB: 1966-09-17                      MRN: [id number]     HPI: Patricia Perry is a 52 y.o. female patient of Dr Marlou Porch referred by Truitt Merle, NP to HTN clinic. PMH is significant for HTN, LVEF 40-45% per 07/2016 echo, and prior concern for TIA vs migraine.  Dietary indiscretion and inactivity have contributed towards high BP. At last HTN visit 4 weeks ago, BP was elevated at 150/92. Toprol was increased to 100mg  daily and spironolactone was increased to 50mg  daily. F/u BMET was stable. Encouraged pt to start walking on a regular basis and start eating some meals at home instead of 100% at restaurants.  Inc spiro or metoprolol or start amlodipine if needed  Patient presents today in good spirits. She reports tolerating her medications well. She denies dizziness, blurred vision, falls, or headaches. She does not have a BP cuff to monitor her BP at home. She was advised at last visit to check BP at the pharmacy but has not done so. Pt downloaded a step tracker on her phone at her last visit in clinic. She has been walking 2,000-6,000 steps per day, does not think she can get to 10,000. She has been more stressed recently due to putting her mother in a home. She does not exercise and does not cook - eats all of her meals at restaurants, mostly [employer], [employer], and [employer]. She likes grilled chicken and broccoli however she admits to having a sweet tooth. Likes [employer]az[employer] ice cream and potato chips.  She has not been to the grocery store since her last visit and continues to eat every meal at restaurants. She does look for healthy options like baked fish and vegetables, however we have discussed that restaurant food is typically much higher in sodium than food prepared at home. We have reviewed nutrition labels as well. She does not do any formal exercise other than some walking at work. She is not motivated to begin exercising.  Current HTN meds:  lisinopril 40mg  daily, Toprol 100mg  daily, spironolactone 50mg  daily  BP goal: <130/33mmHg  Family History: The patient's family history includes Cancer in her father; Diabetes in her mother; Healthy in her sister; Hypertension in her mother.   Social History: The patient  reports that she has never smoked. She has never used smokeless tobacco. She reports that she does not drink alcohol or use drugs.  Diet: Reports she eats everything. Likes grilled chicken and broccoli. Goes to [employer] - likes vegetables. Has a big sweet tooth. Does not add salt to food but does eat out at restaurants a lot. Drinks water with vinegar, water, and orange soda.    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  Home BP readings: Does not have a BP cuff to check.  Wt Readings from Last 3 Encounters:  02/15/18 220 lb 12.8 oz (100.2 kg)  10/27/16 202 lb (91.6 kg)  09/03/16 197 lb 8 oz (89.6 kg)   BP Readings from Last 3 Encounters:  06/29/18 (!) 150/92  06/01/18 (!) 164/102  05/15/18 (!) 166/100   Pulse Readings from Last 3 Encounters:  06/29/18 73  06/01/18 78  05/15/18 75  Renal function: CrCl cannot be calculated (Patient's most recent lab result is older than the maximum 21 days allowed.).  Past Medical History:  Diagnosis Date  . Ejection fraction < 50%    per echo 07-29-2016  ef 40-45%  . Heavy menstrual bleeding   . History of herpes simplex type 2 infection    per pcp note 2016 tested positive  . Hypertension   . Iron deficiency anemia due to chronic blood loss dx 05/28/2015  Hg was 5.6/  last Hg 11.2 of 05/ 2017 in epic   severe per dr Alvy Bimler note ([facility])  iron supplement up to twice daily (per note pt not always compliant)  . Menorrhagia   . 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  . Transient visual loss of right eye    per pt this episode happened approx. 08/  2017 with headachem took otc med. laid down went away;  pt pcp referred her to neurologist (dr Jaynee Eagles) per note possbile tia vs migraine with aura and ordered stroke work-up, abnormal echo referred to cardiologist (dr Marlou Porch) , dx hypertension, ef 40-45%, put on bb and increased arb medication ---  MRI/MRA pending to determine tia/cva or migraine, pt cx'd   . Uterine fibroid   . Uterine polyp     Current Outpatient Medications on File Prior to Visit  Medication Sig Dispense Refill  . BIOTIN PO Take 1 capsule by mouth every evening.    Marland Kitchen lisinopril (PRINIVIL,ZESTRIL) 40 MG tablet Take 1 tablet (40 mg total) by mouth daily. 90 tablet 3  . metoprolol succinate (TOPROL-XL) 100 MG 24 hr tablet Take 1 tablet (100 mg total) by mouth daily. 30 tablet 11  . spironolactone (ALDACTONE) 50 MG tablet Take 1 tablet (50 mg total) by mouth daily. 30 tablet 11   No current facility-administered medications on file prior to visit.     No Known Allergies   Assessment/Plan:  1. Hypertension - BP remains above goal <130/74mmHg secondary to dietary indiscretion and inactivity. Will increase Toprol to 100mg  daily and increase spironolactone to 50mg  daily. Continue lisinopril 40mg  daily. Advised pt to check her BP at local pharmacy a few times within the next 2 weeks. Will f/u BMET in 2 weeks and in HTN clinic in 4 weeks.  2. Lifestyle - Pt is sedentary and east at restaurants 100% of the time. We have discussed reading nutrition labels when purchasing snacks at the grocery store and picking low sodium options when she eats at restaurants. Pt will continue to monitor daily steps using [place] on her phone - goal of 10,000 steps each day.   Megan E. Supple, PharmD, CPP, [place] [address], [place], [place] [address] Phone: [phone]; Fax: [phone] 08/17/2018 2:02 PM

## 2019-07-26 ENCOUNTER — Telehealth: Payer: Self-pay

## 2019-07-26 NOTE — Telephone Encounter (Signed)
Called pt to st up evisit with JM on 07/30/2019, left message asking pt to call the office.

## 2019-07-26 NOTE — Telephone Encounter (Signed)
Follow up   Patient states that she does not want to setup appointment at this time due to not having health insurance.

## 2020-03-28 ENCOUNTER — Encounter: Payer: Self-pay | Admitting: Cardiology

## 2021-10-01 ENCOUNTER — Other Ambulatory Visit: Payer: Self-pay | Admitting: Obstetrics and Gynecology

## 2021-10-01 DIAGNOSIS — R928 Other abnormal and inconclusive findings on diagnostic imaging of breast: Secondary | ICD-10-CM

## 2021-11-11 ENCOUNTER — Telehealth: Payer: Self-pay

## 2021-11-11 NOTE — Telephone Encounter (Signed)
Received referral request from Dr McComb-gynecologist, to have patient set up for PCP. She has just been going to specialist. This has been set up for 12/07/21 with Patricia Perry as patient preferred female provider.

## 2021-11-16 ENCOUNTER — Other Ambulatory Visit: Payer: BLUE CROSS/BLUE SHIELD

## 2021-12-07 ENCOUNTER — Ambulatory Visit: Payer: Self-pay | Admitting: Family

## 2021-12-21 ENCOUNTER — Other Ambulatory Visit: Payer: Self-pay

## 2021-12-28 ENCOUNTER — Ambulatory Visit: Payer: Self-pay | Admitting: Family

## 2021-12-31 ENCOUNTER — Ambulatory Visit: Payer: Self-pay | Admitting: Family

## 2022-02-09 ENCOUNTER — Ambulatory Visit: Payer: Self-pay | Admitting: Family

## 2022-02-10 ENCOUNTER — Ambulatory Visit: Payer: Self-pay | Admitting: Family

## 2022-02-10 ENCOUNTER — Telehealth: Payer: Self-pay

## 2022-02-10 NOTE — Telephone Encounter (Signed)
Patricia Perry - Client ?Nonclinical Telephone Record  ?[facility]? ?Client Patricia Perry - Client ?Client Site [place] ?Provider AA - PHYSICIAN, UNKNOWN- MD ?Contact Type Call ?Who Is Calling Patient / Member / Family / Caregiver ?Caller Name Katori Wirsing ?Caller Phone Number [phone] ?Patient Name Patricia Perry ?Patient DOB June 23, 1966 ?Call Type Message Only Information Provided ?Reason for Call Request to [facility] Appointment ?Initial Comment Caller states she has an appt in the morning at 740 and she says she has a death in the family ?so she needs to cancel ?Patient request to speak to RN No ?Disp. Time Disposition Final User ?02/09/2022 5:17:23 PM General Information Provided Yes Puentes, Yvette ?Call Closed By: Wilson Singer ?Transaction Date/Time: 02/09/2022 5:14:16 PM (ET ?
